# Patient Record
Sex: Female | Born: 1975 | Race: White | Hispanic: No | Marital: Married | Smoking: Never smoker
Health system: Southern US, Community
[De-identification: ages and names within clinical notes are randomized; demographics above are authoritative.]

## PROBLEM LIST (undated history)

## (undated) DIAGNOSIS — G894 Chronic pain syndrome: Secondary | ICD-10-CM

## (undated) DIAGNOSIS — F419 Anxiety disorder, unspecified: Secondary | ICD-10-CM

## (undated) DIAGNOSIS — F1190 Opioid use, unspecified, uncomplicated: Secondary | ICD-10-CM

## (undated) DIAGNOSIS — T7840XA Allergy, unspecified, initial encounter: Secondary | ICD-10-CM

## (undated) DIAGNOSIS — M797 Fibromyalgia: Secondary | ICD-10-CM

## (undated) DIAGNOSIS — I341 Nonrheumatic mitral (valve) prolapse: Secondary | ICD-10-CM

## (undated) DIAGNOSIS — R000 Tachycardia, unspecified: Secondary | ICD-10-CM

## (undated) DIAGNOSIS — R768 Other specified abnormal immunological findings in serum: Secondary | ICD-10-CM

## (undated) DIAGNOSIS — M069 Rheumatoid arthritis, unspecified: Secondary | ICD-10-CM

## (undated) DIAGNOSIS — G43909 Migraine, unspecified, not intractable, without status migrainosus: Secondary | ICD-10-CM

## (undated) DIAGNOSIS — G8929 Other chronic pain: Secondary | ICD-10-CM

## (undated) DIAGNOSIS — M352 Behcet's disease: Secondary | ICD-10-CM

## (undated) HISTORY — PX: HERNIA REPAIR: SHX51

## (undated) HISTORY — PX: WISDOM TOOTH EXTRACTION: SHX21

## (undated) HISTORY — DX: Behcet's disease: M35.2

## (undated) HISTORY — DX: Fibromyalgia: M79.7

## (undated) HISTORY — DX: Nonrheumatic mitral (valve) prolapse: I34.1

## (undated) HISTORY — DX: Allergy, unspecified, initial encounter: T78.40XA

## (undated) HISTORY — DX: Rheumatoid arthritis, unspecified: M06.9

## (undated) HISTORY — PX: MANDIBLE FRACTURE SURGERY: SHX706

## (undated) HISTORY — DX: Anxiety disorder, unspecified: F41.9

## (undated) HISTORY — DX: Other specified abnormal immunological findings in serum: R76.8

## (undated) HISTORY — DX: Tachycardia, unspecified: R00.0

## (undated) HISTORY — PX: LAPAROSCOPIC ABDOMINAL EXPLORATION: SHX6249

## (undated) HISTORY — DX: Migraine, unspecified, not intractable, without status migrainosus: G43.909

---
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
## 1998-04-05 ENCOUNTER — Other Ambulatory Visit: Admission: RE | Admit: 1998-04-05 | Discharge: 1998-04-05 | Payer: Self-pay | Admitting: Obstetrics and Gynecology

## 1999-05-31 ENCOUNTER — Other Ambulatory Visit: Admission: RE | Admit: 1999-05-31 | Discharge: 1999-05-31 | Payer: Self-pay | Admitting: *Deleted

## 1999-05-31 ENCOUNTER — Encounter (INDEPENDENT_AMBULATORY_CARE_PROVIDER_SITE_OTHER): Payer: Self-pay

## 1999-08-15 ENCOUNTER — Other Ambulatory Visit: Admission: RE | Admit: 1999-08-15 | Discharge: 1999-08-15 | Payer: Self-pay | Admitting: *Deleted

## 2000-09-17 ENCOUNTER — Other Ambulatory Visit: Admission: RE | Admit: 2000-09-17 | Discharge: 2000-09-17 | Payer: Self-pay | Admitting: Obstetrics and Gynecology

## 2001-10-15 ENCOUNTER — Other Ambulatory Visit: Admission: RE | Admit: 2001-10-15 | Discharge: 2001-10-15 | Payer: Self-pay | Admitting: Obstetrics and Gynecology

## 2002-05-19 ENCOUNTER — Ambulatory Visit (HOSPITAL_COMMUNITY): Admission: RE | Admit: 2002-05-19 | Discharge: 2002-05-19 | Payer: Self-pay | Admitting: Obstetrics & Gynecology

## 2002-05-19 ENCOUNTER — Encounter: Payer: Self-pay | Admitting: Obstetrics & Gynecology

## 2002-11-12 ENCOUNTER — Other Ambulatory Visit: Admission: RE | Admit: 2002-11-12 | Discharge: 2002-11-12 | Payer: Self-pay | Admitting: Obstetrics and Gynecology

## 2006-02-08 ENCOUNTER — Other Ambulatory Visit: Admission: RE | Admit: 2006-02-08 | Discharge: 2006-02-08 | Payer: Self-pay | Admitting: Gynecology

## 2006-04-16 ENCOUNTER — Ambulatory Visit (HOSPITAL_COMMUNITY): Admission: RE | Admit: 2006-04-16 | Discharge: 2006-04-16 | Payer: Self-pay | Admitting: Gynecology

## 2006-07-25 ENCOUNTER — Ambulatory Visit (HOSPITAL_COMMUNITY): Admission: RE | Admit: 2006-07-25 | Discharge: 2006-07-25 | Payer: Self-pay | Admitting: Gynecology

## 2006-08-15 ENCOUNTER — Other Ambulatory Visit: Admission: RE | Admit: 2006-08-15 | Discharge: 2006-08-15 | Payer: Self-pay | Admitting: Gynecology

## 2007-01-02 ENCOUNTER — Other Ambulatory Visit: Admission: RE | Admit: 2007-01-02 | Discharge: 2007-01-02 | Payer: Self-pay | Admitting: Gynecology

## 2007-06-26 ENCOUNTER — Other Ambulatory Visit: Admission: RE | Admit: 2007-06-26 | Discharge: 2007-06-26 | Payer: Self-pay | Admitting: Gynecology

## 2007-12-18 ENCOUNTER — Other Ambulatory Visit: Admission: RE | Admit: 2007-12-18 | Discharge: 2007-12-18 | Payer: Self-pay | Admitting: Gynecology

## 2008-02-27 HISTORY — PX: LAPAROSCOPY: SHX197

## 2010-06-05 ENCOUNTER — Ambulatory Visit: Payer: Self-pay | Admitting: Cardiology

## 2010-10-27 ENCOUNTER — Ambulatory Visit: Payer: Self-pay | Admitting: Internal Medicine

## 2011-01-15 DIAGNOSIS — M810 Age-related osteoporosis without current pathological fracture: Secondary | ICD-10-CM | POA: Insufficient documentation

## 2011-01-15 DIAGNOSIS — M352 Behcet's disease: Secondary | ICD-10-CM | POA: Insufficient documentation

## 2011-01-16 DIAGNOSIS — Z79899 Other long term (current) drug therapy: Secondary | ICD-10-CM | POA: Insufficient documentation

## 2011-01-16 DIAGNOSIS — M797 Fibromyalgia: Secondary | ICD-10-CM | POA: Insufficient documentation

## 2011-08-10 ENCOUNTER — Ambulatory Visit: Payer: Self-pay

## 2011-11-21 DIAGNOSIS — M069 Rheumatoid arthritis, unspecified: Secondary | ICD-10-CM | POA: Insufficient documentation

## 2012-07-08 ENCOUNTER — Ambulatory Visit: Payer: Self-pay

## 2014-10-18 ENCOUNTER — Encounter: Payer: Self-pay | Admitting: Family Medicine

## 2014-10-18 ENCOUNTER — Ambulatory Visit (INDEPENDENT_AMBULATORY_CARE_PROVIDER_SITE_OTHER): Payer: 59 | Admitting: Family Medicine

## 2014-10-18 VITALS — BP 118/83 | HR 119 | Temp 99.2°F | Ht 63.0 in | Wt 100.0 lb

## 2014-10-18 DIAGNOSIS — M25521 Pain in right elbow: Secondary | ICD-10-CM | POA: Diagnosis not present

## 2014-10-18 NOTE — Progress Notes (Signed)
  BP 118/83 mmHg  Pulse 119  Temp(Src) 99.2 F (37.3 C)  Ht 5\' 3"  (1.6 m)  Wt 100 lb (45.36 kg)  BMI 17.72 kg/m2  SpO2 96%   Subjective:    Patient ID: , female    DOB: October 01, 1975, 39 y.o.   MRN: [id number]  HPI: Rebekah Martin is a 39 y.o. female  Chief Complaint  Patient presents with  . Arm Pain   ARM PAIN Duration: 1 weeks Location: right forearm and elbow Mechanism of injury: unknown Onset: sudden Severity: 7/10 with extension and pressure Quality:  aching, sore and tender Frequency: intermittent Radiation: no Aggravating factors: pushing on it and extending her elbow  Alleviating factors: nothing  Status: stable Treatments attempted: rest and aleve  Relief with NSAIDs?:  no Swelling: yes Redness: no  Warmth: no Trauma: no Chest pain: no  Shortness of breath: no  Fever: no Decreased sensation: no Paresthesias: no Weakness: no  Relevant past medical, surgical, family and social history reviewed and updated as indicated. Interim medical history since our last visit reviewed. Allergies and medications reviewed and updated.  Review of Systems  Constitutional: Negative.   Respiratory: Negative.   Cardiovascular: Negative.   Musculoskeletal: Positive for myalgias. Negative for back pain, joint swelling, arthralgias, gait problem, neck pain and neck stiffness.  Psychiatric/Behavioral: Negative.     Per HPI unless specifically indicated above     Objective:    BP 118/83 mmHg  Pulse 119  Temp(Src) 99.2 F (37.3 C)  Ht 5\' 3"  (1.6 m)  Wt 100 lb (45.36 kg)  BMI 17.72 kg/m2  SpO2 96%  Wt Readings from Last 3 Encounters:  10/18/14 100 lb (45.36 kg)    Physical Exam  Constitutional: She is oriented to person, place, and time. She appears well-developed and well-nourished. No distress.  HENT:  Head: Normocephalic and atraumatic.  Right Ear: Hearing normal.  Left Ear: Hearing normal.  Nose: Nose normal.  Eyes: Conjunctivae and lids are  normal. Right eye exhibits no discharge. Left eye exhibits no discharge. No scleral icterus.  Pulmonary/Chest: Effort normal. No respiratory distress.  Musculoskeletal: Normal range of motion.  Mild swelling on the medial side of her R forearm with some mild tenderness to palpation. No discrete nodules or lipomas.   Neurological: She is alert and oriented to person, place, and time.  Skin: Skin is intact. No rash noted.  Psychiatric: She has a normal mood and affect. Her speech is normal and behavior is normal. Judgment and thought content normal. Cognition and memory are normal.  Nursing note and vitals reviewed.   No results found for this or any previous visit.    Assessment & Plan:   Problem List Items Addressed This Visit    None    Visit Diagnoses    Right elbow pain    -  Primary    Seems to be muscular. Will ice/heat depending on comfort and continue meloxicam. If not better in 5 days, will give Rx for epicondylitis brace. Monitor.         Follow up plan: Return if symptoms worsen or fail to improve.

## 2014-10-18 NOTE — Patient Instructions (Signed)
Medial Epicondylitis (Golfer's Elbow) with Rehab Medial epicondylitis involves inflammation and pain around the inner (medial) portion of the elbow. This pain is caused by inflammation of the tendons in the forearm that flex (bring down) the wrist. Medial epicondylitis is also called golfer's elbow, because it is common among golfers. However, it may occur in any individual who flexes the wrist regularly. If medial epicondylitis is left untreated, it may become a chronic problem. SYMPTOMS   Pain, tenderness, or inflammation over the inner (medial) side of the elbow.  Pain or weakness with gripping activities.  Pain that increases with wrist twisting motions (using a screwdriver, playing golf, bowling). CAUSES  Medial epicondylitis is caused by inflammation of the tendons that flex the wrist. Causes of injury may include:  Chronic, repetitive stress and strain to the tendons that run from the wrist and forearm to the elbow.  Sudden strain on the forearm, including wrist snap when serving balls with racquet sports, or throwing a baseball. RISK INCREASES WITH:  Sports or occupations that require repetitive and/or strenuous forearm and wrist movements (pitching a baseball, golfing, carpentry).  Poor wrist and forearm strength and flexibility.  Failure to warm up properly before activity.  Resuming activity before healing, rehabilitation, and conditioning are complete. PREVENTION   Warm up and stretch properly before activity.  Maintain physical fitness:  Strength, flexibility, and endurance.  Cardiovascular fitness.  Wear and use properly fitted equipment.  Learn and use proper technique and have a coach correct improper technique.  Wear a tennis elbow (counterforce) brace. PROGNOSIS  The course of this condition depends on the degree of the injury. If treated properly, acute cases (symptoms lasting less than 4 weeks) are often resolved in 2 to 6 weeks. Chronic (longer lasting  cases) often resolve in 3 to 6 months, but may require physical therapy. RELATED COMPLICATIONS   Frequently recurring symptoms, resulting in a chronic problem. Properly treating the problem the first time decreases frequency of recurrence.  Chronic inflammation, scarring, and partial tendon tear, requiring surgery.  Delayed healing or resolution of symptoms. TREATMENT  Treatment first involves the use of ice and medicine, to reduce pain and inflammation. Strengthening and stretching exercises may reduce discomfort, if performed regularly. These exercises may be performed at home, if the condition is an acute injury. Chronic cases may require a referral to a physical therapist for evaluation and treatment. Your caregiver may advise a corticosteroid injection to help reduce inflammation. Rarely, surgery is needed. MEDICATION  If pain medicine is needed, nonsteroidal anti-inflammatory medicines (aspirin and ibuprofen), or other minor pain relievers (acetaminophen), are often advised.  Do not take pain medicine for 7 days before surgery.  Prescription pain relievers may be given, if your caregiver thinks they are needed. Use only as directed and only as much as you need.  Corticosteroid injections may be recommended. These injections should be reserved only for the most severe cases, because they can only be given a certain number of times. HEAT AND COLD  Cold treatment (icing) should be applied for 10 to 15 minutes every 2 to 3 hours for inflammation and pain, and immediately after activity that aggravates your symptoms. Use ice packs or an ice massage.  Heat treatment may be used before performing stretching and strengthening activities prescribed by your caregiver, physical therapist, or athletic trainer. Use a heat pack or a warm water soak. SEEK MEDICAL CARE IF: Symptoms get worse or do not improve in 2 weeks, despite treatment. EXERCISES  RANGE OF MOTION (  ROM) AND STRETCHING EXERCISES -  Epicondylitis, Medial (Golfer's Elbow) These exercises may help you when beginning to rehabilitate your injury. Your symptoms may go away with or without further involvement from your physician, physical therapist or athletic trainer. While completing these exercises, remember:   Restoring tissue flexibility helps normal motion to return to the joints. This allows healthier, less painful movement and activity.  An effective stretch should be held for at least 30 seconds.  A stretch should never be painful. You should only feel a gentle lengthening or release in the stretched tissue. RANGE OF MOTION - Wrist Flexion, Active-Assisted  Extend your right / left elbow with your fingers pointing down.*  Gently pull the back of your hand towards you, until you feel a gentle stretch on the top of your forearm.  Hold this position for __________ seconds. Repeat __________ times. Complete this exercise __________ times per day.  *If directed by your physician, physical therapist or athletic trainer, complete this stretch with your elbow bent, rather than extended. RANGE OF MOTION - Wrist Extension, Active-Assisted  Extend your right / left elbow and turn your palm upwards.*  Gently pull your palm and fingertips back, so your wrist extends and your fingers point more toward the ground.  You should feel a gentle stretch on the inside of your forearm.  Hold this position for __________ seconds. Repeat __________ times. Complete this exercise __________ times per day. *If directed by your physician, physical therapist or athletic trainer, complete this stretch with your elbow bent, rather than extended. STRETCH - Wrist Extension   Place your right / left fingertips on a tabletop leaving your elbow slightly bent. Your fingers should point backwards.  Gently press your fingers and palm down onto the table, by straightening your elbow. You should feel a stretch on the inside of your forearm.  Hold  this position for __________ seconds. Repeat __________ times. Complete this stretch __________ times per day.  STRENGTHENING EXERCISES - Epicondylitis, Medial (Golfer's Elbow) These exercises may help you when beginning to rehabilitate your injury. They may resolve your symptoms with or without further involvement from your physician, physical therapist or athletic trainer. While completing these exercises, remember:   Muscles can gain both the endurance and the strength needed for everyday activities through controlled exercises.  Complete these exercises as instructed by your physician, physical therapist or athletic trainer. Increase the resistance and repetitions only as guided.  You may experience muscle soreness or fatigue, but the pain or discomfort you are trying to eliminate should never worsen during these exercises. If this pain does get worse, stop and make sure you are following the directions exactly. If the pain is still present after adjustments, discontinue the exercise until you can discuss the trouble with your caregiver. STRENGTH - Wrist Flexors  Sit with your right / left forearm palm-up, and fully supported on a table or countertop. Your elbow should be resting below the height of your shoulder. Allow your wrist to extend over the edge of the surface.  Loosely holding a __________ weight, or a piece of rubber exercise band or tubing, slowly curl your hand up toward your forearm.  Hold this position for __________ seconds. Slowly lower the wrist back to the starting position in a controlled manner. Repeat __________ times. Complete this exercise __________ times per day.  STRENGTH - Wrist Extensors  Sit with your right / left forearm palm-down and fully supported. Your elbow should be resting below the height of your shoulder.   Allow your wrist to extend over the edge of the surface.  Loosely holding a __________ weight, or a piece of rubber exercise band or tubing, slowly  curl your hand up toward your forearm.  Hold this position for __________ seconds. Slowly lower the wrist back to the starting position in a controlled manner. Repeat __________ times. Complete this exercise __________ times per day.  STRENGTH - Ulnar Deviators  Stand with a ____________________ weight in your right / left hand, or sit while holding a rubber exercise band or tubing, with your healthy arm supported on a table or countertop.  Move your wrist so that your pinkie travels toward your forearm and your thumb moves away from your forearm.  Hold this position for __________ seconds and then slowly lower the wrist back to the starting position. Repeat __________ times. Complete this exercise __________ times per day STRENGTH - Grip   Grasp a tennis ball, a dense sponge, or a large, rolled sock in your hand.  Squeeze as hard as you can, without increasing any pain.  Hold this position for __________ seconds. Release your grip slowly. Repeat __________ times. Complete this exercise __________ times per day.  STRENGTH - Forearm Supinators   Sit with your right / left forearm supported on a table, keeping your elbow below shoulder height. Rest your hand over the edge, palm down.  Gently grip a hammer or a soup ladle.  Without moving your elbow, slowly turn your palm and hand upward to a "thumbs-up" position.  Hold this position for __________ seconds. Slowly return to the starting position. Repeat __________ times. Complete this exercise __________ times per day.  STRENGTH - Forearm Pronators  Sit with your right / left forearm supported on a table, keeping your elbow below shoulder height. Rest your hand over the edge, palm up.  Gently grip a hammer or a soup ladle.  Without moving your elbow, slowly turn your palm and hand upward to a "thumbs-up" position.  Hold this position for __________ seconds. Slowly return to the starting position. Repeat __________ times. Complete  this exercise __________ times per day.  Document Released: 02/12/2005 Document Revised: 05/07/2011 Document Reviewed: 05/27/2008 [employer] Patient Information 2015 [employer]. This information is not intended to replace advice given to you by your health care provider. Make sure you discuss any questions you have with your health care provider.  

## 2015-03-04 DIAGNOSIS — M797 Fibromyalgia: Secondary | ICD-10-CM | POA: Diagnosis not present

## 2015-03-04 DIAGNOSIS — M0609 Rheumatoid arthritis without rheumatoid factor, multiple sites: Secondary | ICD-10-CM | POA: Diagnosis not present

## 2015-03-04 DIAGNOSIS — Z79899 Other long term (current) drug therapy: Secondary | ICD-10-CM | POA: Diagnosis not present

## 2015-03-04 DIAGNOSIS — M810 Age-related osteoporosis without current pathological fracture: Secondary | ICD-10-CM | POA: Diagnosis not present

## 2015-03-04 DIAGNOSIS — M352 Behcet's disease: Secondary | ICD-10-CM | POA: Diagnosis not present

## 2015-03-15 DIAGNOSIS — R768 Other specified abnormal immunological findings in serum: Secondary | ICD-10-CM | POA: Insufficient documentation

## 2015-03-15 DIAGNOSIS — M352 Behcet's disease: Secondary | ICD-10-CM | POA: Diagnosis not present

## 2015-05-19 ENCOUNTER — Other Ambulatory Visit: Payer: Self-pay

## 2015-05-19 MED ORDER — ONDANSETRON HCL 4 MG PO TABS: 4.0000 mg | ORAL_TABLET | Freq: Three times a day (TID) | ORAL | Status: DC | PRN

## 2015-05-19 NOTE — Telephone Encounter (Signed)
Pharmacy is employee pharmacy.

## 2015-08-11 ENCOUNTER — Telehealth: Payer: Self-pay | Admitting: Pharmacist

## 2015-08-11 NOTE — Telephone Encounter (Signed)
Attempted to contact patient to schedule an appointment for the [facility] Specialty Medication Clinical and I was unable to reach her as the number has been disconnected. Will send a HIPAA-compliant email to ask for the best way to contact her.

## 2015-08-16 ENCOUNTER — Encounter: Payer: Self-pay | Admitting: Pharmacist

## 2015-08-23 ENCOUNTER — Ambulatory Visit (HOSPITAL_BASED_OUTPATIENT_CLINIC_OR_DEPARTMENT_OTHER): Payer: Self-pay | Admitting: Pharmacist

## 2015-08-23 DIAGNOSIS — M069 Rheumatoid arthritis, unspecified: Secondary | ICD-10-CM

## 2015-08-23 MED ORDER — ADALIMUMAB 40 MG/0.8ML ~~LOC~~ AJKT: 40.0000 mg | AUTO-INJECTOR | SUBCUTANEOUS | Status: DC

## 2015-08-23 NOTE — Progress Notes (Signed)
S: Patient presents today to the [facility].  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 Patient is managed by [facility] for this.   Adherence: denies any missed doses  Dosing:  Rheumatoid arthritis: SubQ: 40 mg every other week (may continue methotrexate, other nonbiologic DMARDS, corticosteroids, NSAIDs, and/or analgesics); patients not taking concomitant methotrexate may increase dose to 40 mg every week  Drug-drug interactions: none  Screening: TB test: completed Hepatitis: completed  Monitoring: S/sx of infection: denies CBC: last one at [facility] January 2017. S/sx of hypersensitivity: denies S/sx of malignancy: denies S/sx of heart failure: denies    O:  Labs reviewed via Care Everywhere: WBC 3.9, Hgb 11.5, LFTs WNL     No results found for: WBC, HGB, HCT, MCV, PLT    Chemistry   No results found for: NA, K, CL, CO2, BUN, CREATININE, GLU No results found for: CALCIUM, ALKPHOS, AST, ALT, BILITOT     A/P: 1. Medication review: Patient on Humira for RA and Behcet's syndrome and is tolerating it well, with no adverse effects, and good efficacy. Reviewed the medication with her, including the following: Humira is a TNF blocking agent indicated for ankylosing spondylitis, Crohn's disease, Hidradenitis suppurativa, psoriatic arthritis, plaque psoriasis, ulcerative colitis, and uveitis. The most common adverse effects are infections, headache, and injection site reactions. There is the possibility of an increased risk of malignancy but it is not well understood if this increased risk is due to there medication or the disease state. There are rare cases of pancytopenia and aplastic anemia. No recommendations for any changes.   Juanita Craver, PharmD, BCPS, CPP Clinical Pharmacist Practitioner  [facility] [phone]  Evaluation and management procedures were  performed by the Clinical Pharmacy Practitioner under my supervision and collaboration. I have reviewed the Practitioner's note and chart, and I agree with the management and plan as documented above.   Jeanann Lewandowsky, MD, MHA, CPE, FACP, FAAP [facility] [place], [place] [phone]   08/23/2015, 4:01 PM

## 2015-08-24 MED FILL — HUMIRA PEN 40 MG/0.8ML PNKT: 40 | 28 days supply | Qty: 2 | Fill #0

## 2015-09-29 MED FILL — HUMIRA PEN 40 MG/0.8ML PNKT: 40 | 28 days supply | Qty: 2 | Fill #1

## 2015-10-25 MED FILL — HUMIRA PEN 40 MG/0.8ML PNKT: 40 | 28 days supply | Qty: 2 | Fill #2

## 2015-11-23 MED FILL — HUMIRA PEN 40 MG/0.8ML PNKT: 40 | 28 days supply | Qty: 2 | Fill #3

## 2015-12-22 MED FILL — HUMIRA PEN 40 MG/0.8ML PNKT: 40 | 28 days supply | Qty: 2 | Fill #4

## 2016-01-17 MED FILL — HUMIRA PEN 40 MG/0.8ML PNKT: 40 | 28 days supply | Qty: 2 | Fill #5

## 2016-02-07 ENCOUNTER — Ambulatory Visit: Payer: 59 | Attending: Anesthesiology | Admitting: Anesthesiology

## 2016-02-07 ENCOUNTER — Encounter: Payer: Self-pay | Admitting: Anesthesiology

## 2016-02-07 VITALS — BP 119/65 | HR 123 | Temp 97.8°F | Resp 16 | Ht 63.0 in | Wt 100.0 lb

## 2016-02-07 DIAGNOSIS — G894 Chronic pain syndrome: Secondary | ICD-10-CM

## 2016-02-07 DIAGNOSIS — M06031 Rheumatoid arthritis without rheumatoid factor, right wrist: Secondary | ICD-10-CM | POA: Diagnosis not present

## 2016-02-07 DIAGNOSIS — M06032 Rheumatoid arthritis without rheumatoid factor, left wrist: Secondary | ICD-10-CM

## 2016-02-07 DIAGNOSIS — M352 Behcet's disease: Secondary | ICD-10-CM | POA: Diagnosis not present

## 2016-02-07 DIAGNOSIS — M797 Fibromyalgia: Secondary | ICD-10-CM | POA: Diagnosis not present

## 2016-02-07 NOTE — Progress Notes (Signed)
Safety precautions to be maintained throughout the outpatient stay will include: orient to surroundings, keep bed in low position, maintain call bell within reach at all times, provide assistance with transfer out of bed and ambulation.  

## 2016-02-08 NOTE — Progress Notes (Signed)
Subjective:  Patient ID: Rebekah Martin, female    DOB: 1975-04-24  Age: 40 y.o. MRN: [id number]  CC: Oral Pain (mouth ulcers); Knee Pain (both); and Joint Swelling (both)      PROCEDURE:None  HPI Rebekah Martin presents for a new patient evaluation. She is seen by Dr. Willette Pa at [facility] rheumatology. 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 She also has a hx of Behcets disease. She describes diffuse body pain it's been present for 17 years. It affects her shoulders and low back wrists knees calves and shins ankles and feet. She has been on tramadol taking this 2 times a day which helps and Mobic which helps. Unfortunately she also gets occasional oral ulcerations and these are ineffective for that. She has taken local anesthetic topical last lotion which is helped with the oral ulcerations when they occur. Her joint pain is persistent and has  gradually gotten worse. She describes a pain that is present morning and night during exercise and after exercise and aggravated by bending climbing kneeling lifting motion in sitting. Alleviating factors include warm Martin and medication management sleep rest walking cold and hot application. The pain is sharp tender throbbing tiring and located in most joints. It is disabling. She has failed conservative therapy with simple anti-inflammatory and Tylenol and has been on tramadol with good success and no significant side effects. She has never had any injection therapy for her back pain. She has been on oral steroids. Tramadol yields 80% improvement in her pain. She denies numbness or tingling affecting the lower extremities.  History Olanda has a past medical history of Allergy; Anxiety; Behcet's disease (HCC); Fibromyalgia; Migraine; Mitral valve prolapse; Positive ANA (antinuclear antibody); RA (rheumatoid arthritis) (HCC); Rheumatoid arthritis (HCC); and Tachycardia.   She has a past surgical history that  includes Laparoscopic abdominal exploration; Mandible fracture surgery; Wisdom tooth extraction; Hernia repair; and laparoscopy (2010).   Her family history includes Alzheimer's disease in her paternal grandmother; COPD in her paternal grandfather; Cancer in her maternal grandfather; Hyperlipidemia in her mother; Hypertension in her father, maternal grandmother, and mother; Lung disease in her maternal grandmother.She reports that she has never smoked. She has never used smokeless tobacco. She reports that she drinks alcohol. She reports that she does not use drugs.  No results found for this or any previous visit.  No results found for: TOXASSSELUR  Outpatient Medications Prior to Visit  Medication Sig Dispense Refill  . Adalimumab (HUMIRA PEN) 40 MG/0.8ML PNKT Inject 40 mg into the skin every 14 (fourteen) days. 2 each 6  . docusate sodium (STOOL SOFTENER) 100 MG capsule Take 100 mg by mouth 2 (two) times daily.    . DULoxetine (CYMBALTA) 30 MG capsule Take 30 mg by mouth daily.    . meloxicam (MOBIC) 15 MG tablet Take 15 mg by mouth daily as needed for pain.    Marland Kitchen ondansetron (ZOFRAN) 4 MG tablet Take 1 tablet (4 mg total) by mouth every 8 (eight) hours as needed. 20 tablet 1  . sulfaSALAzine (AZULFIDINE) 500 MG tablet Take 500 mg by mouth 4 (four) times daily. Three tablets in the morning and 2 tablets in the evening    . traMADol (ULTRAM) 50 MG tablet Take by mouth every 6 (six) hours as needed.    . triamcinolone (KENALOG) 0.1 % paste Apply to affected area(s) 2 times per day as needed    . Calcium Carbonate-Vit D-Min (CALCIUM 600 +  MINERALS PO) Take 600 mg by mouth 2 (two) times daily.    . clonazePAM (KLONOPIN) 0.5 MG tablet Take 0.5 mg by mouth daily as needed for anxiety.    . Multiple Vitamins-Minerals (MULTIVITAMIN ADULT PO) Take by mouth daily.    . predniSONE (DELTASONE) 5 MG tablet Take 10 mg by mouth daily with breakfast.    . propranolol (INDERAL) 10 MG tablet Take 10 mg by  mouth 4 (four) times daily as needed.    . ranitidine (ZANTAC) 150 MG tablet Take 150 mg by mouth daily.    . SUMAtriptan (IMITREX) 25 MG tablet Take 25 mg by mouth every 2 (two) hours as needed for migraine. May repeat in 2 hours if headache persists or recurs.     No facility-administered medications prior to visit.    No results found for: WBC, HGB, HCT, PLT, GLUCOSE, CHOL, TRIG, HDL, LDLDIRECT, LDLCALC, ALT, AST, NA, K, CL, CREATININE, BUN, CO2, TSH, PSA, INR, GLUF, HGBA1C, MICROALBUR  --------------------------------------------------------------------------------------------------------------------- Dg Knee Complete 4 Views Right  Result Date: 07/08/2012 * PRIOR REPORT IMPORTED FROM AN EXTERNAL SYSTEM * PRIOR REPORT IMPORTED FROM THE SYNGO WORKFLOW SYSTEM REASON FOR EXAM:    right knee pain 1 week COMMENTS: PROCEDURE:     GDR - GDR KNEE RT COMP WITH OBLIQUES  - Jul 08 2012 12:51PM RESULT:     Images of the right knee demonstrate no definite fracture, dislocation or radiopaque foreign body. IMPRESSION:      Please see above. Dictation Site: 2        ---------------------------------------------------------------------------------------------------------------------- Past Medical History:  Diagnosis Date  . Allergy   . Anxiety   . Behcet's disease (HCC)   . Fibromyalgia   . Migraine   . Mitral valve prolapse   . Positive ANA (antinuclear antibody)   . RA (rheumatoid arthritis) (HCC)   . Rheumatoid arthritis (HCC)   . Tachycardia     Past Surgical History:  Procedure Laterality Date  . HERNIA REPAIR     age 69  . LAPAROSCOPIC ABDOMINAL EXPLORATION     X 2  . LAPAROSCOPY  2010   ovarian cyst and endometriosis  . MANDIBLE FRACTURE SURGERY    . WISDOM TOOTH EXTRACTION      Family History  Problem Relation Age of Onset  . Hypertension Mother   . Hyperlipidemia Mother   . Hypertension Father   . Hypertension Maternal Grandmother   . Lung disease Maternal Grandmother    . Cancer Maternal Grandfather     melanoma  . Alzheimer's disease Paternal Grandmother   . COPD Paternal Grandfather     Social History  Substance Use Topics  . Smoking status: Never Smoker  . Smokeless tobacco: Never Used  . Alcohol use Yes     Comment: on occasion    ---------------------------------------------------------------------------------------------------------------------- Social History   Social History  . Marital status: Married    Spouse name: N/A  . Number of children: N/A  . Years of education: N/A   Social History Main Topics  . Smoking status: Never Smoker  . Smokeless tobacco: Never Used  . Alcohol use Yes     Comment: on occasion  . Drug use: No  . Sexual activity: Yes    Birth control/ protection: None   Other Topics Concern  . Not on file   Social History Narrative  . No narrative on file    Scheduled Meds: Continuous Infusions: PRN Meds:.   BP 119/65 (BP Location: Right Arm, Patient Position: Sitting, Cuff  Size: Normal)   Pulse (!) 123   Temp 97.8 F (36.6 C) (Oral)   Resp 16   Ht 5\' 3"  (1.6 m)   Wt 100 lb (45.4 kg)   LMP 01/31/2016   SpO2 100%   BMI 17.71 kg/m    BP Readings from Last 3 Encounters:  02/07/16 119/65  10/18/14 118/83     Wt Readings from Last 3 Encounters:  02/07/16 100 lb (45.4 kg)  10/18/14 100 lb (45.4 kg)     ----------------------------------------------------------------------------------------------------------------------  ROS Review of Systems  Cardiac: No angina Pulmonary: No shortness of breath or wheezing Psychological: History of anxiety but no suicidal ideation Musculoskeletal: Some intermittent lower extremity weakness Rheumatologic: History of diffuse joint pain as mentioned above  Objective:  BP 119/65 (BP Location: Right Arm, Patient Position: Sitting, Cuff Size: Normal)   Pulse (!) 123   Temp 97.8 F (36.6 C) (Oral)   Resp 16   Ht 5\' 3"  (1.6 m)   Wt 100 lb (45.4 kg)    LMP 01/31/2016   SpO2 100%   BMI 17.71 kg/m   Physical Exam  Patient is alert oriented cooperative compliant Pupils are equally round reactive to light Heart is regular rate and rhythm without murmur Lungs are clear to also dictation without wheeze With the patient in the seated position she has Ace negative straight leg raise bilaterally. She does have diminished strength on flexion at the knees rated at a 4+ over 5 which she reports has been chronic. She ambulates with a normal gait and no apparent weakness. She does have diminished muscle bulk in the lower extremities but she is also slight in body habitus. No fasciculations are noted     Assessment & Plan:   Rebekah Martin was seen today for oral pain, knee pain and joint swelling.  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) -     ToxASSURE Select 13 (MW), Urine  Chronic pain syndrome -     ToxASSURE Select 13 (MW), Urine  Behcet's syndrome (HCC) -     ToxASSURE Select 13 (MW), Urine     ----------------------------------------------------------------------------------------------------------------------  Problem List Items Addressed This Visit      Other   Behcet's syndrome (HCC)   Relevant Orders   ToxASSURE Select 13 (MW), Urine    Other Visit Diagnoses    Fibromyalgia syndrome    -  Primary   Relevant Orders   ToxASSURE Select 13 (MW), Urine   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 Fibromyalgia syndrome I think it's reasonable for her to continue with the tramadol as this is well tolerated and of her low risk profile. She has medications to get her until her next appointment in one month at which point we will cover this for her. - ToxASSURE Select 13 (MW), Urine  2. 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 Chronic pain syndrome As above   4. Behcet's syndrome (HCC)  In reference to the lower extremity weakness on flexion at the knees this has  been stable but should she note any changes, nerve conduction studies may be warranted.    ----------------------------------------------------------------------------------------------------------------------  I am having Rebekah Martin maintain her ranitidine, DULoxetine, predniSONE, meloxicam, clonazePAM, propranolol, sulfaSALAzine, SUMAtriptan, traMADol, Calcium Carbonate-Vit D-Min (CALCIUM 600 + MINERALS PO), docusate sodium, Multiple Vitamins-Minerals (MULTIVITAMIN ADULT PO), triamcinolone, ondansetron, Adalimumab, hydroxychloroquine, hydroxychloroquine, nystatin, sulfaSALAzine, clonazePAM, docusate sodium, DULoxetine, meloxicam, ondansetron, propranolol, ranitidine, SUMAtriptan, traMADol, triamcinolone, and Esomeprazole Magnesium (NEXIUM PO).   Meds ordered this encounter  Medications  . hydroxychloroquine (PLAQUENIL) 200 MG tablet    Sig: Take by mouth.  . hydroxychloroquine (PLAQUENIL) 200 MG tablet    Refill:  11  . nystatin (MYCOSTATIN) 100000 UNIT/ML suspension    Sig: 5 ml swish and swallow 4 times a day as needed  . sulfaSALAzine (AZULFIDINE) 500 MG EC tablet    Sig: Take 1,500 mg by mouth daily.     Refill:  3  . clonazePAM (KLONOPIN) 0.5 MG tablet     Sig: as needed.  . docusate sodium (STOOL SOFTENER) 100 MG capsule  . DULoxetine (CYMBALTA) 30 MG capsule    Sig: Take by mouth.  . meloxicam (MOBIC) 15 MG tablet    Sig: Take by mouth.  . ondansetron (ZOFRAN) 4 MG tablet  . propranolol (INDERAL) 10 MG tablet    Sig: prn  . ranitidine (ZANTAC) 150 MG tablet  . SUMAtriptan (IMITREX) 25 MG tablet    Sig: Take by mouth.  . traMADol (ULTRAM) 50 MG tablet    Sig: Take by mouth.  . triamcinolone (KENALOG) 0.1 % paste    Sig: Apply to affected area(s) 2 times per day as needed  . Esomeprazole Magnesium (NEXIUM PO)    Sig: Take 22.3 mg by mouth daily.       Follow-up: Return in about 1 month (around 03/09/2016) for evaluation, med refill.    Yevette EdwardsJames G Shakari Qazi, MD  The [place] practitioner database for opioid medications on this patient has been reviewed by me and my staff   Greater than 50% of the total encounter time was spent in counseling and / or coordination of care.     This dictation was performed utilizing Conservation officer, historic buildingsDragon voice recognition software.  Please excuse any unintentional or mistaken typographical errors as a result.

## 2016-02-10 ENCOUNTER — Ambulatory Visit (INDEPENDENT_AMBULATORY_CARE_PROVIDER_SITE_OTHER): Payer: 59 | Admitting: Family Medicine

## 2016-02-10 ENCOUNTER — Encounter: Payer: Self-pay | Admitting: Family Medicine

## 2016-02-10 VITALS — BP 131/85 | HR 138 | Temp 98.9°F | Ht 63.0 in | Wt 100.0 lb

## 2016-02-10 DIAGNOSIS — R6889 Other general symptoms and signs: Secondary | ICD-10-CM | POA: Diagnosis not present

## 2016-02-10 DIAGNOSIS — R112 Nausea with vomiting, unspecified: Secondary | ICD-10-CM

## 2016-02-10 LAB — VERITOR FLU A/B WAIVED
INFLUENZA A: NEGATIVE
INFLUENZA B: NEGATIVE

## 2016-02-10 MED ORDER — AMOXICILLIN-POT CLAVULANATE 875-125 MG PO TABS: 1.0000 | ORAL_TABLET | Freq: Two times a day (BID) | ORAL | 0 refills | Status: DC

## 2016-02-10 MED ORDER — PROMETHAZINE HCL 25 MG/ML IJ SOLN
25.0000 mg | Freq: Once | INTRAMUSCULAR | Status: AC
  Administered 2016-02-10: 25 mg via INTRAMUSCULAR

## 2016-02-10 MED ORDER — OSELTAMIVIR PHOSPHATE 75 MG PO CAPS: 75.0000 mg | ORAL_CAPSULE | Freq: Two times a day (BID) | ORAL | 0 refills | Status: DC

## 2016-02-10 NOTE — Patient Instructions (Signed)
Follow up if no improvement 

## 2016-02-10 NOTE — Progress Notes (Signed)
BP 131/85 (BP Location: Left Arm, Patient Position: Sitting, Cuff Size: Normal)   Pulse (!) 138   Temp 98.9 F (37.2 C)   Ht 5\' 3"  (1.6 m)   Wt 100 lb (45.4 kg)   LMP 01/31/2016   SpO2 100%   BMI 17.71 kg/m    Subjective:    Patient ID: 14/06/2015, female    DOB: Dec 28, 1975, 40 y.o.   MRN: [id number]  HPI: Rebekah Martin is a 40 y.o. female  Chief Complaint  Patient presents with  . URI   Patient presents with over a week of nasal congestion, ear pain, productive cough, sore throat, and fatigue. Started suddenly 2 days ago with vomiting, severe body aches and chills, low grade fevers, and now diarrhea. Taking cough drops, theraflu, and mucinex with no relief. 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  Past Medical History:  Diagnosis Date  . Allergy   . Anxiety   . Behcet's disease (HCC)   . Fibromyalgia   . Migraine   . Mitral valve prolapse   . Positive ANA (antinuclear antibody)   . RA (rheumatoid arthritis) (HCC)   . Rheumatoid arthritis (HCC)   . Tachycardia    Social History   Social History  . Marital status: Married    Spouse name: N/A  . Number of children: N/A  . Years of education: N/A   Occupational History  . Not on file.   Social History Main Topics  . Smoking status: Never Smoker  . Smokeless tobacco: Never Used  . Alcohol use Yes     Comment: on occasion  . Drug use: No  . Sexual activity: Yes    Birth control/ protection: None   Other Topics Concern  . Not on file   Social History Narrative  . No narrative on file    Relevant past medical, surgical, family and social history reviewed and updated as indicated. Interim medical history since our last visit reviewed. Allergies and medications reviewed and updated.  Review of Systems  Constitutional: Positive for chills, diaphoresis and fatigue. Negative for fever.  HENT:  Positive for congestion, ear pain and sore throat.   Eyes: Negative.   Respiratory: Positive for cough. Negative for chest tightness, shortness of breath and wheezing.   Cardiovascular: Negative.   Gastrointestinal: Positive for diarrhea, nausea and vomiting. Negative for abdominal pain.  Genitourinary: Negative.   Musculoskeletal: Positive for arthralgias and myalgias.  Skin: Negative.   Neurological: Negative.   Psychiatric/Behavioral: Negative.     Per HPI unless specifically indicated above     Objective:    BP 131/85 (BP Location: Left Arm, Patient Position: Sitting, Cuff Size: Normal)   Pulse (!) 138   Temp 98.9 F (37.2 C)   Ht 5\' 3"  (1.6 m)   Wt 100 lb (45.4 kg)   LMP 01/31/2016   SpO2 100%   BMI 17.71 kg/m   Wt Readings from Last 3 Encounters:  02/10/16 100 lb (45.4 kg)  02/07/16 100 lb (45.4 kg)  10/18/14 100 lb (45.4 kg)    Physical Exam  Constitutional: She is oriented to person, place, and time. She appears well-developed and well-nourished.  HENT:  Head: Atraumatic.  Right Ear: External ear normal.  Left Ear: External ear normal.  B/l TMs with mild effusion Nasal mucosa injected Oropharynx erythematous  Neck: Normal range of motion. Neck supple.  Cardiovascular:  Tachycardic  Pulmonary/Chest: Effort normal and breath sounds normal. No respiratory distress.  Abdominal: Soft. Bowel sounds are normal.  Musculoskeletal: Normal range of motion.  Neurological: She is alert and oriented to person, place, and time.  Skin: Skin is warm and dry.  Psychiatric: She has a normal mood and affect. Her behavior is normal.  Nursing note and vitals reviewed.   No results found for this or any previous visit.    Assessment & Plan:   Problem List Items Addressed This Visit    None    Visit Diagnoses    Flu-like symptoms    -  Primary   Rapid flu negative, but given immune status will treat with tamiflu. Will also send augmentin as probable bacterial URI under  new onset viral sxs.    Relevant Medications   promethazine (PHENERGAN) injection 25 mg (Completed)   Other Relevant Orders   Veritor Flu A/B Waived   Nausea and vomiting, intractability of vomiting not specified, unspecified vomiting type       IM phenergan given today. Imodium and zofran at home as needed. Follow up if no improvement   Relevant Medications   promethazine (PHENERGAN) injection 25 mg (Completed)       Follow up plan: Return if symptoms worsen or fail to improve.

## 2016-02-13 LAB — TOXASSURE SELECT 13 (MW), URINE

## 2016-02-15 ENCOUNTER — Ambulatory Visit
Admission: RE | Admit: 2016-02-15 | Discharge: 2016-02-15 | Disposition: A | Discharge status: Home / Self Care | Payer: 59 | Source: Ambulatory Visit | Attending: Family Medicine | Admitting: Family Medicine

## 2016-02-15 ENCOUNTER — Other Ambulatory Visit: Payer: Self-pay | Admitting: Family Medicine

## 2016-02-15 ENCOUNTER — Encounter: Payer: Self-pay | Admitting: Family Medicine

## 2016-02-15 ENCOUNTER — Ambulatory Visit (INDEPENDENT_AMBULATORY_CARE_PROVIDER_SITE_OTHER): Payer: 59 | Admitting: Family Medicine

## 2016-02-15 VITALS — BP 137/94 | HR 147 | Temp 97.7°F | Ht 63.0 in | Wt 100.0 lb

## 2016-02-15 DIAGNOSIS — J069 Acute upper respiratory infection, unspecified: Secondary | ICD-10-CM

## 2016-02-15 DIAGNOSIS — R05 Cough: Secondary | ICD-10-CM | POA: Insufficient documentation

## 2016-02-15 DIAGNOSIS — R059 Cough, unspecified: Secondary | ICD-10-CM

## 2016-02-15 DIAGNOSIS — R918 Other nonspecific abnormal finding of lung field: Secondary | ICD-10-CM | POA: Insufficient documentation

## 2016-02-15 MED ORDER — ALBUTEROL SULFATE (2.5 MG/3ML) 0.083% IN NEBU: 2.5000 mg | INHALATION_SOLUTION | Freq: Once | RESPIRATORY_TRACT | Status: AC

## 2016-02-15 MED ORDER — HYDROCOD POLST-CPM POLST ER 10-8 MG/5ML PO SUER: 5.0000 mL | Freq: Two times a day (BID) | ORAL | 0 refills | Status: DC | PRN

## 2016-02-15 MED ORDER — ALBUTEROL SULFATE HFA 108 (90 BASE) MCG/ACT IN AERS: 2.0000 | INHALATION_SPRAY | Freq: Four times a day (QID) | RESPIRATORY_TRACT | 0 refills | Status: DC | PRN

## 2016-02-15 MED ORDER — AZITHROMYCIN 250 MG PO TABS: ORAL_TABLET | ORAL | 0 refills | Status: DC

## 2016-02-15 MED ORDER — TRIAMCINOLONE ACETONIDE 40 MG/ML IJ SUSP
40.0000 mg | Freq: Once | INTRAMUSCULAR | Status: AC
  Administered 2016-02-15: 40 mg via INTRAMUSCULAR

## 2016-02-15 NOTE — Progress Notes (Signed)
BP (!) 137/94 (BP Location: Left Arm, Patient Position: Sitting, Cuff Size: Small)   Pulse (!) 147   Temp 97.7 F (36.5 C)   Ht 5\' 3"  (1.6 m)   Wt 100 lb (45.4 kg)   LMP 01/31/2016   SpO2 100%   BMI 17.71 kg/m    Subjective:    Patient ID: 14/06/2015, female    DOB: 11/08/1975, 40 y.o.   MRN: [id number]  HPI: Rebekah Martin is a 40 y.o. female  Chief Complaint  Patient presents with  . Follow-up   Patient presents for worsening URI symptoms since she was in last week. States she is getting some improvement as far as nasal congestion and sinus pressure, but now feels like things are settling into her chest. Has been feeling intermittently SOB, and cough has become much worse and is constant. Finishing a course of augmentin with minimal improvement. Denies fever, chills, aches at this point.   Relevant past medical, surgical, family and social history reviewed and updated as indicated. Interim medical history since our last visit reviewed. Allergies and medications reviewed and updated.  Review of Systems  Constitutional: Negative.   HENT: Positive for congestion and sore throat.   Eyes: Negative.   Respiratory: Positive for cough, chest tightness and shortness of breath.   Cardiovascular: Negative.   Gastrointestinal: Positive for nausea.  Genitourinary: Negative.   Musculoskeletal: Negative.   Skin: Negative.   Neurological: Negative.   Psychiatric/Behavioral: Negative.     Per HPI unless specifically indicated above     Objective:    BP (!) 137/94 (BP Location: Left Arm, Patient Position: Sitting, Cuff Size: Small)   Pulse (!) 147   Temp 97.7 F (36.5 C)   Ht 5\' 3"  (1.6 m)   Wt 100 lb (45.4 kg)   LMP 01/31/2016   SpO2 100%   BMI 17.71 kg/m   Wt Readings from Last 3 Encounters:  02/15/16 100 lb (45.4 kg)  02/10/16 100 lb (45.4 kg)  02/07/16 100 lb (45.4 kg)    Physical Exam  Constitutional: She is oriented to person, place, and time. She appears  well-developed and well-nourished.  HENT:  Head: Atraumatic.  Right Ear: External ear normal.  Left Ear: External ear normal.  Nose: Nose normal.  Mouth/Throat: Oropharynx is clear and moist. No oropharyngeal exudate.  Eyes: Conjunctivae are normal. Pupils are equal, round, and reactive to light.  Neck: Normal range of motion. Neck supple.  Cardiovascular: Normal rate, regular rhythm and normal heart sounds.   Pulmonary/Chest: Effort normal. No respiratory distress. She has wheezes (mild wheezes). She has no rales.  Musculoskeletal: Normal range of motion.  Neurological: She is alert and oriented to person, place, and time.  Skin: Skin is warm and dry.  Psychiatric: She has a normal mood and affect. Her behavior is normal.  Nursing note and vitals reviewed.   Results for orders placed or performed in visit on 02/10/16  Veritor Flu A/B Waived  Result Value Ref Range   Influenza A Negative Negative   Influenza B Negative Negative      Assessment & Plan:   Problem List Items Addressed This Visit    None    Visit Diagnoses    Upper respiratory tract infection, unspecified type    -  Primary   Await CXR result, but no evidence of pneumonia on exam. IM Kenalog and albuterol neb given today. Will treat with z-pak, tessalon, and tussionex   Relevant Medications  triamcinolone acetonide (KENALOG-40) injection 40 mg (Completed)   azithromycin (ZITHROMAX) 250 MG tablet   albuterol (PROVENTIL) (2.5 MG/3ML) 0.083% nebulizer solution 2.5 mg       Follow up plan: Return if symptoms worsen or fail to improve.

## 2016-02-15 NOTE — Patient Instructions (Signed)
Follow up if worsening or no improvement 

## 2016-02-24 MED FILL — HUMIRA PEN 40 MG/0.8ML PNKT: 40 | 28 days supply | Qty: 2 | Fill #6

## 2016-03-05 ENCOUNTER — Encounter: Payer: Self-pay | Admitting: Anesthesiology

## 2016-03-05 ENCOUNTER — Ambulatory Visit: Payer: 59 | Attending: Anesthesiology | Admitting: Anesthesiology

## 2016-03-05 VITALS — BP 125/73 | HR 143 | Temp 97.5°F | Resp 18 | Ht 63.0 in | Wt 95.0 lb

## 2016-03-05 DIAGNOSIS — M06031 Rheumatoid arthritis without rheumatoid factor, right wrist: Secondary | ICD-10-CM

## 2016-03-05 DIAGNOSIS — M352 Behcet's disease: Secondary | ICD-10-CM | POA: Insufficient documentation

## 2016-03-05 DIAGNOSIS — M069 Rheumatoid arthritis, unspecified: Secondary | ICD-10-CM | POA: Insufficient documentation

## 2016-03-05 DIAGNOSIS — M25562 Pain in left knee: Secondary | ICD-10-CM | POA: Insufficient documentation

## 2016-03-05 DIAGNOSIS — M797 Fibromyalgia: Secondary | ICD-10-CM | POA: Insufficient documentation

## 2016-03-05 DIAGNOSIS — M25561 Pain in right knee: Secondary | ICD-10-CM | POA: Diagnosis not present

## 2016-03-05 DIAGNOSIS — K1379 Other lesions of oral mucosa: Secondary | ICD-10-CM | POA: Insufficient documentation

## 2016-03-05 DIAGNOSIS — M06032 Rheumatoid arthritis without rheumatoid factor, left wrist: Secondary | ICD-10-CM

## 2016-03-05 DIAGNOSIS — G894 Chronic pain syndrome: Secondary | ICD-10-CM | POA: Diagnosis not present

## 2016-03-05 DIAGNOSIS — Z79899 Other long term (current) drug therapy: Secondary | ICD-10-CM | POA: Insufficient documentation

## 2016-03-05 MED ORDER — TRAMADOL HCL 50 MG PO TABS: 50.0000 mg | ORAL_TABLET | Freq: Two times a day (BID) | ORAL | 2 refills | Status: DC

## 2016-03-05 NOTE — Progress Notes (Signed)
Safety precautions to be maintained throughout the outpatient stay will include: orient to surroundings, keep bed in low position, maintain call bell within reach at all times, provide assistance with transfer out of bed and ambulation.  

## 2016-03-06 NOTE — Progress Notes (Signed)
Subjective:  Patient ID: Rebekah Martin, female    DOB: Jan 06, 1976  Age: 41 y.o. MRN: [id number]  CC: Knee Pain (bilateral) and Oral Pain (ulcers)   Service Provided on Last Visit: Med Refill, Evaluation  PROCEDURE:None  HPI Rebekah Martin presents for reevaluation. She was last seen 1 month ago. She has been taking her Ultram as prescribed and based on her assessment sheet this continues to give her good relief. The quality characteristic and distribution of her pain remains stable in nature. She has had a recent flu virus that this has resolved. Otherwise she continues to have pain in the knees and ankles with periodic oral ulcerations. The Ultram manages the pain from these conditions well and she denies any significant side effects with it.   By history she presents via referral from Dr. Willette Pa at [facility] rheumatology. 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 She also has a hx of Behcets disease. She describes diffuse body pain it's been present for 17 years. It affects her shoulders and low back wrists knees calves and shins ankles and feet. She has been on tramadol taking this 2 times a day which helps and Mobic which helps. Unfortunately she also gets occasional oral ulcerations and these are ineffective for that. She has taken local anesthetic topical last lotion which is helped with the oral ulcerations when they occur. Her joint pain is persistent and has  gradually gotten worse. She describes a pain that is present morning and night during exercise and after exercise and aggravated by bending climbing kneeling lifting motion in sitting. Alleviating factors include warm Martin and medication management sleep rest walking cold and hot application. The pain is sharp tender throbbing tiring and located in most joints. It is disabling. She has failed conservative therapy with simple anti-inflammatory and Tylenol and has been on tramadol with good  success and no significant side effects. She has never had any injection therapy for her back pain. She has been on oral steroids. Tramadol yields 80% improvement in her pain. She denies numbness or tingling affecting the lower extremities.  History Rebekah Martin has a past medical history of Allergy; Anxiety; Behcet's disease (HCC); Fibromyalgia; Migraine; Mitral valve prolapse; Positive ANA (antinuclear antibody); RA (rheumatoid arthritis) (HCC); Rheumatoid arthritis (HCC); and Tachycardia.   She has a past surgical history that includes Laparoscopic abdominal exploration; Mandible fracture surgery; Wisdom tooth extraction; Hernia repair; and laparoscopy (2010).   Her family history includes Alzheimer's disease in her paternal grandmother; COPD in her paternal grandfather; Cancer in her maternal grandfather; Hyperlipidemia in her mother; Hypertension in her father, maternal grandmother, and mother; Lung disease in her maternal grandmother.She reports that she has never smoked. She has never used smokeless tobacco. She reports that she drinks alcohol. She reports that she does not use drugs.  No results found for this or any previous visit.  Tox[employer]ure Select 13  Date Value Ref Range Status  02/07/2016 FINAL  Final    Comment:    ==================================================================== TOXASSURE SELECT 13 (MW) ==================================================================== Test                             Result       Flag       Units Drug Present and Declared for Prescription Verification   Tramadol  PRESENT      EXPECTED   O-Desmethyltramadol            PRESENT      EXPECTED   N-Desmethyltramadol            PRESENT      EXPECTED    Source of tramadol is a prescription medication.    O-desmethyltramadol and N-desmethyltramadol are expected    metabolites of tramadol. Drug Absent but Declared for Prescription Verification   Clonazepam                     Not  Detected UNEXPECTED ng/mg creat ==================================================================== Test                      Result    Flag   Units      Ref Range   Creatinine              63               mg/dL      >=16 ==================================================================== Declared Medications:  The flagging and interpretation on this report are based on the  following declared medications.  Unexpected results may arise from  inaccuracies in the declared medications.  **Note: The testing scope of this panel includes these medications:  Clonazepam (Klonopin)  Tramadol (Ultram)  **Note: The testing scope of this panel does not include following  reported medications:  Adalimumab  Calcium Carbonate (Calcarb with Vitamin D)  Docusate (Colace)  Duloxetine (Cymbalta)  Hydroxychloroquine (Plaquenil)  Meloxicam (Mobic)  Nystatin (Mycostatin)  Ondansetron (Zofran)  Prednisone (Deltasone)  Propranolol (Inderal)  Ranitidine (Zantac)  Sulfasalazine (Azulfidine)  Sumatriptan (Imitrex)  Triamcinolone (Kenalog)  Vitamin D (Calcarb with Vitamin D) ==================================================================== For clinical consultation, please call [phone]. ====================================================================     Outpatient Medications Prior to Visit  Medication Sig Dispense Refill  . Adalimumab (HUMIRA PEN) 40 MG/0.8ML PNKT Inject 40 mg into the skin every 14 (fourteen) days. 2 each 6  . albuterol (PROVENTIL HFA;VENTOLIN HFA) 108 (90 Base) MCG/ACT inhaler Inhale 2 puffs into the lungs every 6 (six) hours as needed for wheezing or shortness of breath. 1 Inhaler 0  . Calcium Carbonate-Vit D-Min (CALCIUM 600 + MINERALS PO) Take 600 mg by mouth 2 (two) times daily.    . chlorpheniramine-HYDROcodone (TUSSIONEX PENNKINETIC ER) 10-8 MG/5ML SUER Take 5 mLs by mouth every 12 (twelve) hours as needed for cough. 115 mL 0  . docusate sodium (STOOL  SOFTENER) 100 MG capsule Take 100 mg by mouth 2 (two) times daily.    . DULoxetine (CYMBALTA) 30 MG capsule Take by mouth.    . Esomeprazole Magnesium (NEXIUM PO) Take 22.3 mg by mouth daily.    . hydroxychloroquine (PLAQUENIL) 200 MG tablet Take by mouth.    . meloxicam (MOBIC) 15 MG tablet Take 15 mg by mouth daily as needed for pain.    . Multiple Vitamins-Minerals (MULTIVITAMIN ADULT PO) Take by mouth daily.    . ondansetron (ZOFRAN) 4 MG tablet     . sulfaSALAzine (AZULFIDINE) 500 MG EC tablet Take 1,500 mg by mouth daily.   3  . SUMAtriptan (IMITREX) 25 MG tablet Take by mouth.    . triamcinolone (KENALOG) 0.1 % paste Apply to affected area(s) 2 times per day as needed    . traMADol (ULTRAM) 50 MG tablet Take by mouth.    Marland Kitchen amoxicillin-clavulanate (AUGMENTIN) 875-125 MG tablet Take 1 tablet by mouth 2 (two) times daily. (Patient  not taking: Reported on 03/05/2016) 14 tablet 0  . azithromycin (ZITHROMAX) 250 MG tablet Take 2 tablets day one, then 1 tablet daily (Patient not taking: Reported on 03/05/2016) 6 tablet 0  . clonazePAM (KLONOPIN) 0.5 MG tablet as needed.    . nystatin (MYCOSTATIN) 100000 UNIT/ML suspension 5 ml swish and swallow 4 times a day as needed    . propranolol (INDERAL) 10 MG tablet Take 10 mg by mouth 4 (four) times daily as needed.    . ranitidine (ZANTAC) 150 MG tablet Take 150 mg by mouth daily.     Facility-Administered Medications Prior to Visit  Medication Dose Route Frequency Provider Last Rate Last Dose  . albuterol (PROVENTIL) (2.5 MG/3ML) 0.083% nebulizer solution 2.5 mg  2.5 mg Nebulization Once [employer], PA-C       No results found for: WBC, HGB, HCT, PLT, GLUCOSE, CHOL, TRIG, HDL, LDLDIRECT, LDLCALC, ALT, AST, NA, K, CL, CREATININE, BUN, CO2, TSH, PSA, INR, GLUF, HGBA1C, MICROALBUR  --------------------------------------------------------------------------------------------------------------------- Dg Knee Complete 4 Views Right  Result Date:  07/08/2012 * PRIOR REPORT IMPORTED FROM AN EXTERNAL SYSTEM * PRIOR REPORT IMPORTED FROM THE SYNGO WORKFLOW SYSTEM REASON FOR EXAM:    right knee pain 1 week COMMENTS: PROCEDURE:     GDR - GDR KNEE RT COMP WITH OBLIQUES  - Jul 08 2012 12:51PM RESULT:     Images of the right knee demonstrate no definite fracture, dislocation or radiopaque foreign body. IMPRESSION:      Please see above. Dictation Site: 2        ---------------------------------------------------------------------------------------------------------------------- Past Medical History:  Diagnosis Date  . Allergy   . Anxiety   . Behcet's disease (HCC)   . Fibromyalgia   . Migraine   . Mitral valve prolapse   . Positive ANA (antinuclear antibody)   . RA (rheumatoid arthritis) (HCC)   . Rheumatoid arthritis (HCC)   . Tachycardia     Past Surgical History:  Procedure Laterality Date  . HERNIA REPAIR     age 35  . LAPAROSCOPIC ABDOMINAL EXPLORATION     X 2  . LAPAROSCOPY  2010   ovarian cyst and endometriosis  . MANDIBLE FRACTURE SURGERY    . WISDOM TOOTH EXTRACTION      Family History  Problem Relation Age of Onset  . Hypertension Mother   . Hyperlipidemia Mother   . Hypertension Father   . Hypertension Maternal Grandmother   . Lung disease Maternal Grandmother   . Cancer Maternal Grandfather     melanoma  . Alzheimer's disease Paternal Grandmother   . COPD Paternal Grandfather     Social History  Substance Use Topics  . Smoking status: Never Smoker  . Smokeless tobacco: Never Used  . Alcohol use Yes     Comment: on occasion    ---------------------------------------------------------------------------------------------------------------------- Social History   Social History  . Marital status: Married    Spouse name: N/A  . Number of children: N/A  . Years of education: N/A   Social History Main Topics  . Smoking status: Never Smoker  . Smokeless tobacco: Never Used  . Alcohol use Yes      Comment: on occasion  . Drug use: No  . Sexual activity: Yes    Birth control/ protection: None   Other Topics Concern  . None   Social History Narrative  . None    Scheduled Meds: Continuous Infusions: PRN Meds:.   BP 125/73   Pulse (!) 143   Temp 97.5 F (36.4  C) (Oral)   Resp 18   Ht 5\' 3"  (1.6 m)   Wt 95 lb (43.1 kg)   LMP 03/03/2016 (Exact Date)   SpO2 100%   BMI 16.83 kg/m    BP Readings from Last 3 Encounters:  03/05/16 125/73  02/15/16 (!) 137/94  02/10/16 131/85     Wt Readings from Last 3 Encounters:  03/05/16 95 lb (43.1 kg)  02/15/16 100 lb (45.4 kg)  02/10/16 100 lb (45.4 kg)     ----------------------------------------------------------------------------------------------------------------------  ROS Review of Systems  Cardiac: No angina GI: No constipation  Objective:  BP 125/73   Pulse (!) 143   Temp 97.5 F (36.4 C) (Oral)   Resp 18   Ht 5\' 3"  (1.6 m)   Wt 95 lb (43.1 kg)   LMP 03/03/2016 (Exact Date)   SpO2 100%   BMI 16.83 kg/m   Physical Exam  Patient is alert oriented cooperative compliant Pupils are equally round reactive to light Heart is regular rate and rhythm without murmur Lungs are clear to also dictation without wheeze   Assessment & Plan:   Rheana was seen today for knee pain and oral pain.  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 Fibromyalgia syndrome I think it's reasonable for her to continue with the tramadol as this is well tolerated and of her low risk profile. She has Refill medications to get her until her next appointment in 2 months - ToxASSURE Select 13 (MW), Urine  2. 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 Chronic pain syndrome As above   4. Behcet's syndrome (HCC)  In reference to the lower extremity weakness on flexion at the knees this has  been stable but should she note any changes, nerve conduction studies may be warranted.    ----------------------------------------------------------------------------------------------------------------------  I have changed Ms. Roarty's traMADol. I am also having her maintain her ranitidine, meloxicam, propranolol, Calcium Carbonate-Vit D-Min (CALCIUM 600 + MINERALS PO), docusate sodium, Multiple Vitamins-Minerals (MULTIVITAMIN ADULT PO), Adalimumab, hydroxychloroquine, nystatin, sulfaSALAzine, clonazePAM, DULoxetine, ondansetron, SUMAtriptan, triamcinolone, Esomeprazole Magnesium (NEXIUM PO), amoxicillin-clavulanate, azithromycin, albuterol, and chlorpheniramine-HYDROcodone. We will continue to administer albuterol.   Meds ordered this encounter  Medications  . traMADol (ULTRAM) 50 MG tablet    Sig: Take 1 tablet (50 mg total) by mouth 2 (two) times daily.    Dispense:  60 tablet    Refill:  2       Follow-up: Return in about 2 months (around 05/03/2016) for evaluation, med refill.    [name], MD  The  practitioner database for opioid medications on  this patient has been reviewed by me and my  staff   Greater than 50% of the total encounter time was spent in counseling and / or coordination of care.     This dictation was performed utilizing Conservation officer, historic buildings.  Please excuse any unintentional or mistaken typographical errors as a result.

## 2016-04-05 ENCOUNTER — Other Ambulatory Visit: Payer: Self-pay | Admitting: Pharmacist

## 2016-04-05 MED ORDER — ADALIMUMAB 40 MG/0.8ML ~~LOC~~ AJKT: 0.8000 mL | AUTO-INJECTOR | SUBCUTANEOUS | 11 refills | Status: DC

## 2016-04-05 MED FILL — HUMIRA PEN 40 MG/0.8ML PNKT: 40 | 28 days supply | Qty: 2 | Fill #0

## 2016-05-09 MED FILL — HUMIRA PEN 40 MG/0.8ML PNKT: 40 | 28 days supply | Qty: 2 | Fill #1

## 2016-05-24 ENCOUNTER — Other Ambulatory Visit: Payer: Self-pay | Admitting: Family Medicine

## 2016-05-24 MED ORDER — CLOBETASOL PROPIONATE 0.05 % EX CREA: 1.0000 "application " | TOPICAL_CREAM | Freq: Two times a day (BID) | CUTANEOUS | 1 refills | Status: DC

## 2016-05-31 ENCOUNTER — Other Ambulatory Visit: Payer: Self-pay | Admitting: Family Medicine

## 2016-05-31 MED ORDER — ONDANSETRON HCL 4 MG PO TABS: 4.0000 mg | ORAL_TABLET | Freq: Three times a day (TID) | ORAL | 0 refills | Status: DC | PRN

## 2016-06-25 ENCOUNTER — Ambulatory Visit: Payer: 59 | Admitting: Anesthesiology

## 2016-07-06 ENCOUNTER — Ambulatory Visit: Payer: 59 | Attending: Anesthesiology | Admitting: Anesthesiology

## 2016-07-06 ENCOUNTER — Encounter: Payer: Self-pay | Admitting: Anesthesiology

## 2016-07-06 VITALS — BP 114/75 | HR 141 | Temp 98.3°F | Resp 18 | Ht 63.0 in | Wt 95.0 lb

## 2016-07-06 DIAGNOSIS — M069 Rheumatoid arthritis, unspecified: Secondary | ICD-10-CM | POA: Insufficient documentation

## 2016-07-06 DIAGNOSIS — M25531 Pain in right wrist: Secondary | ICD-10-CM | POA: Insufficient documentation

## 2016-07-06 DIAGNOSIS — M06032 Rheumatoid arthritis without rheumatoid factor, left wrist: Secondary | ICD-10-CM | POA: Diagnosis not present

## 2016-07-06 DIAGNOSIS — M06031 Rheumatoid arthritis without rheumatoid factor, right wrist: Secondary | ICD-10-CM | POA: Diagnosis not present

## 2016-07-06 DIAGNOSIS — G894 Chronic pain syndrome: Secondary | ICD-10-CM | POA: Diagnosis not present

## 2016-07-06 DIAGNOSIS — M797 Fibromyalgia: Secondary | ICD-10-CM | POA: Diagnosis not present

## 2016-07-06 MED ORDER — TRAMADOL HCL 50 MG PO TABS: 50.0000 mg | ORAL_TABLET | Freq: Four times a day (QID) | ORAL | 2 refills | Status: DC | PRN

## 2016-07-06 NOTE — Patient Instructions (Signed)
Prescription given tramadol with 2 refills.  To return for med refill in 3 months.

## 2016-07-06 NOTE — Progress Notes (Signed)
Nursing Pain Medication Assessment:  Safety precautions to be maintained throughout the outpatient stay will include: orient to surroundings, keep bed in low position, maintain call bell within reach at all times, provide assistance with transfer out of bed and ambulation.  Medication Inspection Compliance: Pill count conducted under aseptic conditions, in front of the patient. Neither the pills nor the bottle was removed from the patient's sight at any time. Once count was completed pills were immediately returned to the patient in their original bottle.  Medication: Tramadol (Ultram) Pill/Patch Count: 19 of 60 pills remain Pill/Patch Appearance: Markings consistent with prescribed medication Bottle Appearance: Standard pharmacy container. Clearly labeled. Filled Date:03 / 28 / 2018 Last Medication intake:  Yesterday

## 2016-07-10 DIAGNOSIS — M352 Behcet's disease: Secondary | ICD-10-CM | POA: Diagnosis not present

## 2016-07-10 DIAGNOSIS — Z79899 Other long term (current) drug therapy: Secondary | ICD-10-CM | POA: Diagnosis not present

## 2016-07-10 DIAGNOSIS — M797 Fibromyalgia: Secondary | ICD-10-CM | POA: Diagnosis not present

## 2016-07-10 DIAGNOSIS — R768 Other specified abnormal immunological findings in serum: Secondary | ICD-10-CM | POA: Diagnosis not present

## 2016-07-10 DIAGNOSIS — M810 Age-related osteoporosis without current pathological fracture: Secondary | ICD-10-CM | POA: Diagnosis not present

## 2016-07-10 DIAGNOSIS — M0609 Rheumatoid arthritis without rheumatoid factor, multiple sites: Secondary | ICD-10-CM | POA: Diagnosis not present

## 2016-07-11 NOTE — Progress Notes (Signed)
Subjective:  Patient ID: Rebekah Martin, female    DOB: [birth year]  Age: 41 y.o. MRN: [id number]  CC: Wrist Pain (right)   Service Provided on Last Visit: Med Refill  PROCEDURE:None  HPI Rebekah Martin was last seen in January. The quality characteristic and distribution of her body pain knee pain and joint pain is stable in nature. She denies any change in upper or lower extremity strength. The pain continues towax and wane throughout the day and generally responds favorably to Ultram. She's been taking this as prescribed and it works well for her with minimal side effects. Based on her narcotic assessment sheet today she is doing well with the medication. Otherwise no change in lower extremity strength or upper shoulder strength or function is noted. She continues to try and do her exercises as tolerated.   By history she presents via referral from Dr. Willette Pa at [facility] rheumatology. 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 She also has a hx of Behcets disease. She describes diffuse body pain it's been present for 17 years. It affects her shoulders and low back wrists knees calves and shins ankles and feet. She has been on tramadol taking this 2 times a day which helps and Mobic which helps. Unfortunately she also gets occasional oral ulcerations and these are ineffective for that. She has taken local anesthetic topical last lotion which is helped with the oral ulcerations when they occur. Her joint pain is persistent and has  gradually gotten worse. She describes a pain that is present morning and night during exercise and after exercise and aggravated by bending climbing kneeling lifting motion in sitting. Alleviating factors include warm Martin and medication management sleep rest walking cold and hot application. The pain is sharp tender throbbing tiring and located in most joints. It is disabling. She has failed conservative therapy with simple  anti-inflammatory and Tylenol and has been on tramadol with good success and no significant side effects. She has never had any injection therapy for her back pain. She has been on oral steroids. Tramadol yields 80% improvement in her pain. She denies numbness or tingling affecting the lower extremities.  History Rebekah Martin has a past medical history of Allergy; Anxiety; Behcet's disease (HCC); Fibromyalgia; Migraine; Mitral valve prolapse; Positive ANA (antinuclear antibody); RA (rheumatoid arthritis) (HCC); Rheumatoid arthritis (HCC); and Tachycardia.   She has a past surgical history that includes Laparoscopic abdominal exploration; Mandible fracture surgery; Wisdom tooth extraction; Hernia repair; and laparoscopy (2010).   Her family history includes Alzheimer's disease in her paternal grandmother; COPD in her paternal grandfather; Cancer in her maternal grandfather; Hyperlipidemia in her mother; Hypertension in her father, maternal grandmother, and mother; Lung disease in her maternal grandmother.She reports that she has never smoked. She has never used smokeless tobacco. She reports that she drinks alcohol. She reports that she does not use drugs.  No results found for this or any previous visit.  [facility]ox[facility] Select 13  Date Value Ref Range Status  02/07/2016 FINAL  Final    Comment:    ==================================================================== TOX[facility] SELECT 13 (MW) ==================================================================== Test                             Result       Flag       Units Drug Present and Declared for Prescription Verification   Tramadol  PRESENT      EXPECTED   O-Desmethyltramadol            PRESENT      EXPECTED   N-Desmethyltramadol            PRESENT      EXPECTED    Source of tramadol is a prescription medication.    O-desmethyltramadol and N-desmethyltramadol are expected    metabolites of tramadol. Drug Absent but Declared for  Prescription Verification   Clonazepam                     Not Detected UNEXPECTED ng/mg creat ==================================================================== Test                      Result    Flag   Units      Ref Range   Creatinine              63               mg/dL      >=81 ==================================================================== Declared Medications:  The flagging and interpretation on this report are based on the  following declared medications.  Unexpected results may arise from  inaccuracies in the declared medications.  **Note: The testing scope of this panel includes these medications:  Clonazepam (Klonopin)  Tramadol (Ultram)  **Note: The testing scope of this panel does not include following  reported medications:  Adalimumab  Calcium Carbonate (Calcarb with Vitamin D)  Docusate (Colace)  Duloxetine (Cymbalta)  Hydroxychloroquine (Plaquenil)  Meloxicam (Mobic)  Nystatin (Mycostatin)  Ondansetron (Zofran)  Prednisone (Deltasone)  Propranolol (Inderal)  Ranitidine (Zantac)  Sulfasalazine (Azulfidine)  Sumatriptan (Imitrex)  Triamcinolone (Kenalog)  Vitamin D (Calcarb with Vitamin D) ==================================================================== For clinical consultation, please call [phone]. ====================================================================     Outpatient Medications Prior to Visit  Medication Sig Dispense Refill  . Adalimumab (HUMIRA PEN) 40 MG/0.8ML PNKT Inject 0.8 mLs into the skin every 14 (fourteen) days. 1 each 11  . albuterol (PROVENTIL HFA;VENTOLIN HFA) 108 (90 Base) MCG/ACT inhaler Inhale 2 puffs into the lungs every 6 (six) hours as needed for wheezing or shortness of breath. 1 Inhaler 0  . Calcium Carbonate-Vit D-Min (CALCIUM 600 + MINERALS PO) Take 600 mg by mouth 2 (two) times daily.    . clobetasol cream (TEMOVATE) 0.05 % Apply 1 application topically 2 (two) times daily. 60 g 1  . docusate sodium  (STOOL SOFTENER) 100 MG capsule Take 100 mg by mouth 2 (two) times daily.    . DULoxetine (CYMBALTA) 30 MG capsule Take by mouth.    . Esomeprazole Magnesium (NEXIUM PO) Take 22.3 mg by mouth daily.    . meloxicam (MOBIC) 15 MG tablet Take 15 mg by mouth daily as needed for pain.    . Multiple Vitamins-Minerals (MULTIVITAMIN ADULT PO) Take by mouth daily.    . ondansetron (ZOFRAN) 4 MG tablet Take 1 tablet (4 mg total) by mouth every 8 (eight) hours as needed for nausea or vomiting. 30 tablet 0  . sulfaSALAzine (AZULFIDINE) 500 MG EC tablet Take 1,500 mg by mouth daily.   3  . SUMAtriptan (IMITREX) 25 MG tablet Take by mouth.    . triamcinolone (KENALOG) 0.1 % paste Apply to affected area(s) 2 times per day as needed    . traMADol (ULTRAM) 50 MG tablet Take 1 tablet (50 mg total) by mouth 2 (two) times daily. 60 tablet 2  . amoxicillin-clavulanate (AUGMENTIN) 875-125 MG tablet Take  1 tablet by mouth 2 (two) times daily. (Patient not taking: Reported on 03/05/2016) 14 tablet 0  . azithromycin (ZITHROMAX) 250 MG tablet Take 2 tablets day one, then 1 tablet daily (Patient not taking: Reported on 03/05/2016) 6 tablet 0  . chlorpheniramine-HYDROcodone (TUSSIONEX PENNKINETIC ER) 10-8 MG/5ML SUER Take 5 mLs by mouth every 12 (twelve) hours as needed for cough. (Patient not taking: Reported on 07/06/2016) 115 mL 0  . clonazePAM (KLONOPIN) 0.5 MG tablet as needed.    . nystatin (MYCOSTATIN) 100000 UNIT/ML suspension 5 ml swish and swallow 4 times a day as needed    . propranolol (INDERAL) 10 MG tablet Take 10 mg by mouth 4 (four) times daily as needed.    . ranitidine (ZANTAC) 150 MG tablet Take 150 mg by mouth daily.     Facility-Administered Medications Prior to Visit  Medication Dose Route Frequency Provider Last Rate Last Dose  . albuterol (PROVENTIL) (2.5 MG/3ML) 0.083% nebulizer solution 2.5 mg  2.5 mg Nebulization Once Particia Nearing, PA-C       No results found for: WBC, HGB, HCT, PLT,  GLUCOSE, CHOL, TRIG, HDL, LDLDIRECT, LDLCALC, ALT, AST, NA, K, CL, CREATININE, BUN, CO2, TSH, PSA, INR, GLUF, HGBA1C, MICROALBUR  --------------------------------------------------------------------------------------------------------------------- Dg Knee Complete 4 Views Right  Result Date: 07/08/2012 * PRIOR REPORT IMPORTED FROM AN EXTERNAL SYSTEM * PRIOR REPORT IMPORTED FROM THE SYNGO WORKFLOW SYSTEM REASON FOR EXAM:    right knee pain 1 week COMMENTS: PROCEDURE:     GDR - GDR KNEE RT COMP WITH OBLIQUES  - Jul 08 2012 12:51PM RESULT:     Images of the right knee demonstrate no definite fracture, dislocation or radiopaque foreign body. IMPRESSION:      Please see above. Dictation Site: 2        ---------------------------------------------------------------------------------------------------------------------- Past Medical History:  Diagnosis Date  . Allergy   . Anxiety   . Behcet's disease (HCC)   . Fibromyalgia   . Migraine   . Mitral valve prolapse   . Positive ANA (antinuclear antibody)   . RA (rheumatoid arthritis) (HCC)   . Rheumatoid arthritis (HCC)   . Tachycardia     Past Surgical History:  Procedure Laterality Date  . HERNIA REPAIR     age 24  . LAPAROSCOPIC ABDOMINAL EXPLORATION     X 2  . LAPAROSCOPY  2010   ovarian cyst and endometriosis  . MANDIBLE FRACTURE SURGERY    . WISDOM TOOTH EXTRACTION      Family History  Problem Relation Age of Onset  . Hypertension Mother   . Hyperlipidemia Mother   . Hypertension Father   . Hypertension Maternal Grandmother   . Lung disease Maternal Grandmother   . Cancer Maternal Grandfather        melanoma  . Alzheimer's disease Paternal Grandmother   . COPD Paternal Grandfather     Social History  Substance Use Topics  . Smoking status: Never Smoker  . Smokeless tobacco: Never Used  . Alcohol use Yes     Comment: on occasion     ---------------------------------------------------------------------------------------------------------------------- Social History   Social History  . Marital status: Married    Spouse name: N/A  . Number of children: N/A  . Years of education: N/A   Social History Main Topics  . Smoking status: Never Smoker  . Smokeless tobacco: Never Used  . Alcohol use Yes     Comment: on occasion  . Drug use: No  . Sexual activity: Yes    Birth  control/ protection: None   Other Topics Concern  . None   Social History Narrative  . None    Scheduled Meds: Continuous Infusions: PRN Meds:.   BP 114/75   Pulse (!) 141   Temp 98.3 F (36.8 C)   Resp 18   Ht 5\' 3"  (1.6 m)   Wt 95 lb (43.1 kg)   LMP 06/22/2016   SpO2 99%   BMI 16.83 kg/m    BP Readings from Last 3 Encounters:  07/06/16 114/75  03/05/16 125/73  02/15/16 (!) 137/94     Wt Readings from Last 3 Encounters:  07/06/16 95 lb (43.1 kg)  03/05/16 95 lb (43.1 kg)  02/15/16 100 lb (45.4 kg)     ----------------------------------------------------------------------------------------------------------------------  ROS Review of Systems  Cardiac: No angina GI: No constipation  Objective:  BP 114/75   Pulse (!) 141   Temp 98.3 F (36.8 C)   Resp 18   Ht 5\' 3"  (1.6 m)   Wt 95 lb (43.1 kg)   LMP 06/22/2016   SpO2 99%   BMI 16.83 kg/m   Physical Exam  Patient is alert oriented cooperative compliant Pupils are equally round reactive to light Heart is regular rate and rhythm without murmur Lungs are clear to also dictation without wheeze   Assessment & Plan:   Rebekah Martin was seen today for wrist pain.  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 Fibromyalgia syndrome Rebekah Martin continues to respond favorably to the Ultram and I think it's reasonable to continue this. We'll refill this medication with return to clinic in 3 months for her. In the meantime on her to continue with her stretching strengthening and core exercises as reviewed today.  2. 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 Chronic pain syndrome As above   4. Behcet's syndrome (HCC)  I   ----------------------------------------------------------------------------------------------------------------------  I have discontinued Rebekah Martin's ranitidine, propranolol, nystatin, clonazePAM, amoxicillin-clavulanate, azithromycin, chlorpheniramine-HYDROcodone, predniSONE, and methylPREDNISolone. I have also changed her traMADol. Additionally, I am having her maintain her meloxicam, Calcium Carbonate-Vit D-Min (CALCIUM 600 + MINERALS PO), docusate sodium, Multiple Vitamins-Minerals (MULTIVITAMIN ADULT PO), sulfaSALAzine, DULoxetine, SUMAtriptan, triamcinolone, Esomeprazole Magnesium (NEXIUM PO), albuterol, Adalimumab, clobetasol cream, ondansetron, and hydroxychloroquine. We will continue to administer albuterol.   Meds ordered this encounter  Medications  .  hydroxychloroquine (PLAQUENIL) 200 MG tablet    Sig: Take 200 mg by mouth daily.   Marland Kitchen DISCONTD: DULoxetine (CYMBALTA) 30 MG capsule    Sig: Take by mouth.  . DISCONTD: meloxicam (MOBIC) 15 MG tablet    Sig: Take by mouth.  . DISCONTD: sulfaSALAzine (AZULFIDINE) 500 MG EC tablet    Sig: Take 3  tabs in the AM and 2 tabs at night  . DISCONTD: hydroxychloroquine (PLAQUENIL) 200 MG tablet    Refill:  11  . DISCONTD: predniSONE (DELTASONE) 5 MG tablet    Refill:  1  . DISCONTD: methylPREDNISolone (MEDROL DOSEPAK) 4 MG TBPK tablet    Refill:  1  . traMADol (ULTRAM) 50 MG tablet    Sig: Take 1 tablet (50 mg total) by mouth every 6 (six) hours as needed.    Dispense:  90 tablet    Refill:  2       Follow-up: Return in about 3 months (around 10/06/2016) for evaluation, med refill.    [name], MD  The [facility] practitioner database for opioid medications on this patient has been reviewed by me and my staff   Greater than 50% of the total encounter time was spent in counseling and / or coordination of care.     This dictation was performed utilizing Conservation officer, historic buildings.  Please excuse any unintentional or mistaken typographical errors as a result.

## 2016-07-19 MED FILL — HUMIRA PEN 40 MG/0.8ML PNKT: 40 | 28 days supply | Qty: 2 | Fill #2

## 2016-07-20 ENCOUNTER — Other Ambulatory Visit: Payer: Self-pay | Admitting: Pharmacist

## 2016-07-20 MED ORDER — ADALIMUMAB 40 MG/0.8ML ~~LOC~~ AJKT: 0.8000 mL | AUTO-INJECTOR | SUBCUTANEOUS | 11 refills | Status: AC

## 2016-08-09 MED FILL — HUMIRA PEN 40 MG/0.8ML PNKT: 40 | 28 days supply | Qty: 2 | Fill #0

## 2016-10-08 MED FILL — HUMIRA PEN 40 MG/0.8ML PNKT: 40 | 28 days supply | Qty: 2 | Fill #1

## 2016-11-13 MED FILL — HUMIRA PEN 40 MG/0.8ML PNKT: 40 | 28 days supply | Qty: 2 | Fill #2

## 2016-11-21 ENCOUNTER — Other Ambulatory Visit: Payer: Self-pay | Admitting: Family Medicine

## 2016-11-21 MED ORDER — CLOBETASOL PROPIONATE 0.05 % EX CREA: 1.0000 "application " | TOPICAL_CREAM | Freq: Two times a day (BID) | CUTANEOUS | 0 refills | Status: AC

## 2016-11-21 NOTE — Progress Notes (Signed)
Pt needing refill on her clobetasol for ulcers popping up on b/l hands

## 2016-11-28 ENCOUNTER — Ambulatory Visit: Payer: 59 | Attending: Anesthesiology | Admitting: Anesthesiology

## 2016-11-28 ENCOUNTER — Encounter: Payer: Self-pay | Admitting: Anesthesiology

## 2016-11-28 VITALS — BP 119/81 | HR 78 | Temp 98.8°F | Resp 16 | Ht 63.0 in | Wt 108.0 lb

## 2016-11-28 DIAGNOSIS — M25531 Pain in right wrist: Secondary | ICD-10-CM | POA: Diagnosis not present

## 2016-11-28 DIAGNOSIS — M792 Neuralgia and neuritis, unspecified: Secondary | ICD-10-CM | POA: Insufficient documentation

## 2016-11-28 DIAGNOSIS — M797 Fibromyalgia: Secondary | ICD-10-CM | POA: Diagnosis not present

## 2016-11-28 DIAGNOSIS — M352 Behcet's disease: Secondary | ICD-10-CM | POA: Insufficient documentation

## 2016-11-28 DIAGNOSIS — M06032 Rheumatoid arthritis without rheumatoid factor, left wrist: Secondary | ICD-10-CM | POA: Diagnosis not present

## 2016-11-28 DIAGNOSIS — M069 Rheumatoid arthritis, unspecified: Secondary | ICD-10-CM | POA: Insufficient documentation

## 2016-11-28 DIAGNOSIS — G894 Chronic pain syndrome: Secondary | ICD-10-CM | POA: Diagnosis not present

## 2016-11-28 DIAGNOSIS — M06031 Rheumatoid arthritis without rheumatoid factor, right wrist: Secondary | ICD-10-CM

## 2016-11-28 MED ORDER — GABAPENTIN 100 MG PO CAPS: 100.0000 mg | ORAL_CAPSULE | Freq: Three times a day (TID) | ORAL | 1 refills | Status: DC

## 2016-11-28 MED ORDER — TRAMADOL HCL 50 MG PO TABS: 50.0000 mg | ORAL_TABLET | Freq: Four times a day (QID) | ORAL | 2 refills | Status: DC | PRN

## 2016-11-28 NOTE — Progress Notes (Signed)
Subjective:  Patient ID: Rebekah Martin, female    DOB: [birth year]  Age: 41 y.o. MRN: [id number]  CC: Wrist Pain (right) and Knee Pain (bilateral)   Procedure: None   HPI Rebekah Martin presents for reevaluation. She was last seen several months ago. She continues to have diffuse body pain secondary to her arthritis and fibromyalgia. She continues to complain of low back pain but no significant radiation of numbness or tingling or sciatica-like symptoms to the lower extremities at this time. Unfortunately the pain has remained quite recalcitrant despite exercise management and conservative therapy. Physical therapy modalities do not seem to help much. No change in bowel or bladder function or lower extremity strength or function are noted. The quality characteristic and distribution of her pain have remained stable as well. She has been taking Ultram for pain relief 3 times a day and this jelly works well. She does frequently have restless leg like symptoms and some occasional burning-like pain radiating into the lower extremities most notable at night. Strength is been stable with no evidence of weakness noted.  Outpatient Medications Prior to Visit  Medication Sig Dispense Refill  . Adalimumab (HUMIRA PEN) 40 MG/0.8ML PNKT Inject 0.8 mLs into the skin every 14 (fourteen) days. 2 each 11  . albuterol (PROVENTIL HFA;VENTOLIN HFA) 108 (90 Base) MCG/ACT inhaler Inhale 2 puffs into the lungs every 6 (six) hours as needed for wheezing or shortness of breath. 1 Inhaler 0  . Calcium Carbonate-Vit D-Min (CALCIUM 600 + MINERALS PO) Take 600 mg by mouth 2 (two) times daily.    . clobetasol cream (TEMOVATE) 0.05 % Apply 1 application topically 2 (two) times daily. 60 g 0  . docusate sodium (STOOL SOFTENER) 100 MG capsule Take 100 mg by mouth 2 (two) times daily.    . DULoxetine (CYMBALTA) 30 MG capsule Take by mouth.    . hydroxychloroquine (PLAQUENIL) 200 MG tablet Take 200 mg by mouth daily.     .  meloxicam (MOBIC) 15 MG tablet Take 15 mg by mouth daily as needed for pain.    . Multiple Vitamins-Minerals (MULTIVITAMIN ADULT PO) Take by mouth daily.    . ondansetron (ZOFRAN) 4 MG tablet Take 1 tablet (4 mg total) by mouth every 8 (eight) hours as needed for nausea or vomiting. 30 tablet 0  . sulfaSALAzine (AZULFIDINE) 500 MG EC tablet Take 1,500 mg by mouth daily.   3  . SUMAtriptan (IMITREX) 25 MG tablet Take by mouth.    . triamcinolone (KENALOG) 0.1 % paste Apply to affected area(s) 2 times per day as needed    . traMADol (ULTRAM) 50 MG tablet Take 1 tablet (50 mg total) by mouth every 6 (six) hours as needed. 90 tablet 2  . Esomeprazole Magnesium (NEXIUM PO) Take 22.3 mg by mouth daily.     Facility-Administered Medications Prior to Visit  Medication Dose Route Frequency Provider Last Rate Last Dose  . albuterol (PROVENTIL) (2.5 MG/3ML) 0.083% nebulizer solution 2.5 mg  2.5 mg Nebulization Once Particia Nearing, PA-C        Review of Systems CNS: No sedation or confusion Cardiac: No angina or palpitations GI: No constipation or abdominal pain  Objective:  BP 119/81   Pulse 78   Temp 98.8 F (37.1 C)   Resp 16   Ht 5\' 3"  (1.6 m)   Wt 108 lb (49 kg)   LMP 10/29/2016   SpO2 100%   BMI 19.13 kg/m    BP Readings  from Last 3 Encounters:  11/28/16 119/81  07/06/16 114/75  03/05/16 125/73     Wt Readings from Last 3 Encounters:  11/28/16 108 lb (49 kg)  07/06/16 95 lb (43.1 kg)  03/05/16 95 lb (43.1 kg)     Physical Exam Pt is alert and oriented PERRL EOMI HEART IS RRR no murmur or rub LCTA no wheezing or rhales MUSCULOSKELETALReveals some mild paraspinous muscle tenderness in the thoracic and lumbar region. She has good range of motion at the lower back and throughout the lower extremities. Her muscle tone and bulk is at baseline. Strength is at baseline.  Labs  No results found for: HGBA1C No results found for: GLUF, MICROALBUR, LDLCALC,  CREATININE  -------------------------------------------------------------------------------------------------------------------- No results found for: WBC, HGB, HCT, PLT, GLUCOSE, CHOL, TRIG, HDL, LDLDIRECT, LDLCALC, ALT, AST, NA, K, CL, CREATININE, BUN, CO2, TSH, PSA, INR, GLUF, HGBA1C, MICROALBUR  --------------------------------------------------------------------------------------------------------------------- Dg Chest 2 View  Result Date: 02/15/2016 CLINICAL DATA:  Cough for 10 days EXAM: CHEST  2 VIEW COMPARISON:  None. FINDINGS: Cardiomediastinal silhouette is unremarkable. No infiltrate or pleural effusion. No pulmonary edema. Mild hyperinflation. Bony thorax unremarkable. IMPRESSION: No active cardiopulmonary disease.  Mild hyperinflation Electronically Signed   By: Natasha Mead M.D.   On: 02/15/2016 13:37     Assessment & Plan:   Rebekah Martin was seen today for wrist pain and knee pain.  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 -     gabapentin (NEURONTIN) 100 MG capsule; Take 1 capsule (100 mg total) by mouth 3 (three) times daily.        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 Fibromyalgia syndrome I'm going to refill her Ultram as this seems to be working well for her based on her narcotic assessment sheet. I'm also going to add in Neurontin for some of the neuralgia-like pain that she is experiencing particularly at night. I want her to start at 100 mg daily at bedtime for the first 3 nights then increase to 2 tablets 4 a total of 200 mg daily at bedtime for the next 3 nights and ultimately to 300 mg at nighttime. She is to return to clinic in 1 month for reevaluation.  2. 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 Chronic pain syndrome   4. Behcet's syndrome (HCC)   5. Neuropathic pain As above. We'll have her return to clinic for reevaluation in one month.    ----------------------------------------------------------------------------------------------------------------------  I am having Rebekah Martin start on gabapentin. I am also having her maintain her meloxicam, Calcium Carbonate-Vit D-Min (CALCIUM  600 + MINERALS PO), docusate sodium, Multiple Vitamins-Minerals (MULTIVITAMIN ADULT PO), sulfaSALAzine, DULoxetine, SUMAtriptan, triamcinolone, Esomeprazole Magnesium (NEXIUM PO), albuterol, ondansetron, hydroxychloroquine, Adalimumab, clobetasol cream, Clobetasol Prop Emollient Base, Adalimumab, albuterol, albuterol, clobetasol cream, docusate sodium, DULoxetine, Esomeprazole Magnesium, meloxicam, ondansetron, sulfaSALAzine, and traMADol. We will continue to administer albuterol.   Meds ordered this encounter  Medications  . Clobetasol Prop Emollient Base (CLOBETASOL PROPIONATE E) 0.05 % emollient cream    Sig: Apply topically.  . Adalimumab 40 MG/0.8ML PNKT    Sig: Inject into the skin.  Marland Kitchen albuterol (PROVENTIL) (2.5 MG/3ML)  0.083% nebulizer solution    Sig: Inhale into the lungs.  Marland Kitchen albuterol (PROVENTIL HFA;VENTOLIN HFA) 108 (90 Base) MCG/ACT inhaler    Sig: Inhale into the lungs.  . clobetasol cream (TEMOVATE) 0.05 %    Sig: as needed.  . docusate sodium (COLACE) 100 MG capsule  . DULoxetine (CYMBALTA) 30 MG capsule    Sig: Take by mouth.  . Esomeprazole Magnesium 20 MG TBEC    Sig: Take by mouth.  . meloxicam (MOBIC) 15 MG tablet    Sig: Take by mouth.  . ondansetron (ZOFRAN) 4 MG tablet    Sig: 4 mg as needed.   . sulfaSALAzine (AZULFIDINE) 500 MG EC tablet    Sig: TAKE 3 TABLETS BY MOUTH IN THE AM AND 2 TABLETS BY MOUTH AT NIGHT  . traMADol (ULTRAM) 50 MG tablet    Sig: Take 1 tablet (50 mg total) by mouth every 6 (six) hours as needed.    Dispense:  90 tablet    Refill:  2  . gabapentin (NEURONTIN) 100 MG capsule    Sig: Take 1 capsule (100 mg total) by mouth 3 (three) times daily.    Dispense:  90 capsule    Refill:  1   Patient's Medications  New Prescriptions   GABAPENTIN (NEURONTIN) 100 MG CAPSULE    Take 1 capsule (100 mg total) by mouth 3 (three) times daily.  Previous Medications   ADALIMUMAB (HUMIRA PEN) 40 MG/0.8ML PNKT    Inject 0.8 mLs into the skin every 14 (fourteen) days.   ADALIMUMAB 40 MG/0.8ML PNKT    Inject into the skin.   ALBUTEROL (PROVENTIL HFA;VENTOLIN HFA) 108 (90 BASE) MCG/ACT INHALER    Inhale 2 puffs into the lungs every 6 (six) hours as needed for wheezing or shortness of breath.   ALBUTEROL (PROVENTIL HFA;VENTOLIN HFA) 108 (90 BASE) MCG/ACT INHALER    Inhale into the lungs.   ALBUTEROL (PROVENTIL) (2.5 MG/3ML) 0.083% NEBULIZER SOLUTION    Inhale into the lungs.   CALCIUM CARBONATE-VIT D-MIN (CALCIUM 600 + MINERALS PO)    Take 600 mg by mouth 2 (two) times daily.   CLOBETASOL CREAM (TEMOVATE) 0.05 %    Apply 1 application topically 2 (two) times daily.   CLOBETASOL CREAM (TEMOVATE) 0.05 %    as needed.   CLOBETASOL PROP EMOLLIENT BASE (CLOBETASOL PROPIONATE E) 0.05  % EMOLLIENT CREAM    Apply topically.   DOCUSATE SODIUM (COLACE) 100 MG CAPSULE       DOCUSATE SODIUM (STOOL SOFTENER) 100 MG CAPSULE    Take 100 mg by mouth 2 (two) times daily.   DULOXETINE (CYMBALTA) 30 MG CAPSULE    Take by mouth.   DULOXETINE (CYMBALTA) 30 MG CAPSULE    Take by mouth.   ESOMEPRAZOLE MAGNESIUM (NEXIUM PO)    Take 22.3 mg by mouth daily.   ESOMEPRAZOLE MAGNESIUM 20 MG TBEC    Take by mouth.  HYDROXYCHLOROQUINE (PLAQUENIL) 200 MG TABLET    Take 200 mg by mouth daily.    MELOXICAM (MOBIC) 15 MG TABLET    Take 15 mg by mouth daily as needed for pain.   MELOXICAM (MOBIC) 15 MG TABLET    Take by mouth.   MULTIPLE VITAMINS-MINERALS (MULTIVITAMIN ADULT PO)    Take by mouth daily.   ONDANSETRON (ZOFRAN) 4 MG TABLET    Take 1 tablet (4 mg total) by mouth every 8 (eight) hours as needed for nausea or vomiting.   ONDANSETRON (ZOFRAN) 4 MG TABLET    4 mg as needed.    SULFASALAZINE (AZULFIDINE) 500 MG EC TABLET    Take 1,500 mg by mouth daily.    SULFASALAZINE (AZULFIDINE) 500 MG EC TABLET    TAKE 3 TABLETS BY MOUTH IN THE AM AND 2 TABLETS BY MOUTH AT NIGHT   SUMATRIPTAN (IMITREX) 25 MG TABLET    Take by mouth.   TRIAMCINOLONE (KENALOG) 0.1 % PASTE    Apply to affected area(s) 2 times per day as needed  Modified Medications   Modified Medication Previous Medication   TRAMADOL (ULTRAM) 50 MG TABLET traMADol (ULTRAM) 50 MG tablet      Take 1 tablet (50 mg total) by mouth every 6 (six) hours as needed.    Take 1 tablet (50 mg total) by mouth every 6 (six) hours as needed.  Discontinued Medications   No medications on file   ----------------------------------------------------------------------------------------------------------------------  Follow-up: Return in about 1 month (around 12/29/2016) for evaluation, med refill.    [name], MD

## 2016-11-28 NOTE — Progress Notes (Signed)
Nursing Pain Medication Assessment:  Safety precautions to be maintained throughout the outpatient stay will include: orient to surroundings, keep bed in low position, maintain call bell within reach at all times, provide assistance with transfer out of bed and ambulation.  Medication Inspection Compliance: Pill count conducted under aseptic conditions, in front of the patient. Neither the pills nor the bottle was removed from the patient's sight at any time. Once count was completed pills were immediately returned to the patient in their original bottle.  Medication: Tramadol (Ultram) Pill/Patch Count: 2 of 90 pills remain Pill/Patch Appearance: Markings consistent with prescribed medication Bottle Appearance: Standard pharmacy container. Clearly labeled. Filled Date: 08 / 13 / 2018 Last Medication intake:  Yesterday

## 2016-12-13 MED FILL — HUMIRA PEN 40 MG/0.8ML PNKT: 40 | 28 days supply | Qty: 2 | Fill #3

## 2017-01-01 ENCOUNTER — Encounter: Payer: Self-pay | Admitting: Anesthesiology

## 2017-01-01 ENCOUNTER — Ambulatory Visit: Payer: 59 | Attending: Anesthesiology | Admitting: Anesthesiology

## 2017-01-01 VITALS — BP 116/75 | HR 136 | Temp 98.4°F | Resp 16 | Ht 63.0 in | Wt 106.0 lb

## 2017-01-01 DIAGNOSIS — G2581 Restless legs syndrome: Secondary | ICD-10-CM | POA: Insufficient documentation

## 2017-01-01 DIAGNOSIS — M352 Behcet's disease: Secondary | ICD-10-CM | POA: Insufficient documentation

## 2017-01-01 DIAGNOSIS — M06032 Rheumatoid arthritis without rheumatoid factor, left wrist: Secondary | ICD-10-CM | POA: Insufficient documentation

## 2017-01-01 DIAGNOSIS — Z79899 Other long term (current) drug therapy: Secondary | ICD-10-CM | POA: Diagnosis not present

## 2017-01-01 DIAGNOSIS — M792 Neuralgia and neuritis, unspecified: Secondary | ICD-10-CM | POA: Insufficient documentation

## 2017-01-01 DIAGNOSIS — M797 Fibromyalgia: Secondary | ICD-10-CM | POA: Insufficient documentation

## 2017-01-01 DIAGNOSIS — M06031 Rheumatoid arthritis without rheumatoid factor, right wrist: Secondary | ICD-10-CM | POA: Diagnosis not present

## 2017-01-01 DIAGNOSIS — G894 Chronic pain syndrome: Secondary | ICD-10-CM | POA: Insufficient documentation

## 2017-01-01 MED ORDER — GABAPENTIN 100 MG PO CAPS: 100.0000 mg | ORAL_CAPSULE | Freq: Three times a day (TID) | ORAL | 2 refills | Status: DC

## 2017-01-01 NOTE — Progress Notes (Signed)
Safety precautions to be maintained throughout the outpatient stay will include: orient to surroundings, keep bed in low position, maintain call bell within reach at all times, provide assistance with transfer out of bed and ambulation.  

## 2017-01-01 NOTE — Progress Notes (Signed)
Subjective:  Patient ID: Rebekah ShowersAmy Rebekah Martin, female    DOB: 06/19/1975  Age: 41 y.o. MRN: [id number]  CC: Joint Pain (arthritic pain) and Generalized Body Aches (fibromyalgia)   Procedure: None  HPI Rebekah Charlyne PetrinJ Martin presents for reevaluation.  She was last seen approximately 3 months ago and has been doing well with her diffuse body pain and fibromyalgia.  She recently started Neurontin 100 mg in the morning and 200 mg at nighttime.  This seems to be working well for her.  She states that she is having less pain in her back and down her legs at night and less restless leg syndrome.  She is also sleeping better and feels that she has made significant improvements between that and the Ultram.  No untoward side effects are noted.  Otherwise she is in her usual state of health without change in upper or lower extremity strength or functional bowel bladder function.  Outpatient Medications Prior to Visit  Medication Sig Dispense Refill  . Adalimumab (HUMIRA PEN) 40 MG/0.8ML PNKT Inject 0.8 mLs into the skin every 14 (fourteen) days. 2 each 11  . albuterol (PROVENTIL HFA;VENTOLIN HFA) 108 (90 Base) MCG/ACT inhaler Inhale 2 puffs into the lungs every 6 (six) hours as needed for wheezing or shortness of breath. 1 Inhaler 0  . Calcium Carbonate-Vit D-Min (CALCIUM 600 + MINERALS PO) Take 600 mg by mouth 2 (two) times daily.    . clobetasol cream (TEMOVATE) 0.05 % Apply 1 application topically 2 (two) times daily. 60 g 0  . Clobetasol Prop Emollient Base (CLOBETASOL PROPIONATE E) 0.05 % emollient cream Apply topically.    . docusate sodium (STOOL SOFTENER) 100 MG capsule Take 100 mg by mouth 2 (two) times daily.    . DULoxetine (CYMBALTA) 30 MG capsule Take by mouth.    . Esomeprazole Magnesium (NEXIUM PO) Take 22.3 mg by mouth daily.    . hydroxychloroquine (PLAQUENIL) 200 MG tablet Take 200 mg by mouth daily.     . meloxicam (MOBIC) 15 MG tablet Take 15 mg by mouth daily as needed for pain.    . Multiple  Vitamins-Minerals (MULTIVITAMIN ADULT PO) Take by mouth daily.    . ondansetron (ZOFRAN) 4 MG tablet Take 1 tablet (4 mg total) by mouth every 8 (eight) hours as needed for nausea or vomiting. 30 tablet 0  . sulfaSALAzine (AZULFIDINE) 500 MG EC tablet Take 1,500 mg by mouth daily.   3  . SUMAtriptan (IMITREX) 25 MG tablet Take as needed by mouth.     . traMADol (ULTRAM) 50 MG tablet Take 1 tablet (50 mg total) by mouth every 6 (six) hours as needed. 90 tablet 2  . triamcinolone (KENALOG) 0.1 % paste Apply to affected area(s) 2 times per day as needed    . gabapentin (NEURONTIN) 100 MG capsule Take 1 capsule (100 mg total) by mouth 3 (three) times daily. 90 capsule 1  . Adalimumab 40 MG/0.8ML PNKT Inject into the skin.    Marland Kitchen. albuterol (PROVENTIL HFA;VENTOLIN HFA) 108 (90 Base) MCG/ACT inhaler Inhale into the lungs.    Marland Kitchen. albuterol (PROVENTIL) (2.5 MG/3ML) 0.083% nebulizer solution Inhale into the lungs.    . clobetasol cream (TEMOVATE) 0.05 % as needed.    . docusate sodium (COLACE) 100 MG capsule     . DULoxetine (CYMBALTA) 30 MG capsule Take by mouth.    . Esomeprazole Magnesium 20 MG TBEC Take by mouth.    . meloxicam (MOBIC) 15 MG tablet Take by mouth.    .Marland Kitchen  ondansetron (ZOFRAN) 4 MG tablet 4 mg as needed.     . sulfaSALAzine (AZULFIDINE) 500 MG EC tablet TAKE 3 TABLETS BY MOUTH IN THE AM AND 2 TABLETS BY MOUTH AT NIGHT     Facility-Administered Medications Prior to Visit  Medication Dose Route Frequency Provider Last Rate Last Dose  . albuterol (PROVENTIL) (2.5 MG/3ML) 0.083% nebulizer solution 2.5 mg  2.5 mg Nebulization Once Particia Nearing, PA-C        Review of Systems CNS: No sedation or confusion Cardiac: No angina or palpitations GI: No constipation or abdominal pain  Objective:  BP 116/75 (BP Location: Right Arm, Patient Position: Sitting, Cuff Size: Normal)   Pulse (!) 136   Temp 98.4 F (36.9 C) (Oral)   Resp 16   Ht 5\' 3"  (1.6 m)   Wt 106 lb (48.1 kg)   LMP  12/12/2016   SpO2 100%   BMI 18.78 kg/m    BP Readings from Last 3 Encounters:  01/01/17 116/75  11/28/16 119/81  07/06/16 114/75     Wt Readings from Last 3 Encounters:  01/01/17 106 lb (48.1 kg)  11/28/16 108 lb (49 kg)  07/06/16 95 lb (43.1 kg)     Physical Exam Pt is alert and oriented PERRL EOMI HEART IS RRR no murmur or rub LCTA no wheezing or rhales MUSCULOSKELETAL reveals good strength throughout the lower and upper extremities and she appears to be ambulating well without an antalgic gait.  Labs  No results found for: HGBA1C No results found for: GLUF, MICROALBUR, LDLCALC, CREATININE  -------------------------------------------------------------------------------------------------------------------- No results found for: WBC, HGB, HCT, PLT, GLUCOSE, CHOL, TRIG, HDL, LDLDIRECT, LDLCALC, ALT, AST, NA, K, CL, CREATININE, BUN, CO2, TSH, PSA, INR, GLUF, HGBA1C, MICROALBUR  --------------------------------------------------------------------------------------------------------------------- Dg Chest 2 View  Result Date: 02/15/2016 CLINICAL DATA:  Cough for 10 days EXAM: CHEST  2 VIEW COMPARISON:  None. FINDINGS: Cardiomediastinal silhouette is unremarkable. No infiltrate or pleural effusion. No pulmonary edema. Mild hyperinflation. Bony thorax unremarkable. IMPRESSION: No active cardiopulmonary disease.  Mild hyperinflation Electronically Signed   By: 02/17/2016 M.D.   On: 02/15/2016 13:37     Assessment & Plan:   Rebekah Martin was seen today for joint pain and generalized body aches.  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 Fibromyalgia syndrome We will refill her medications for the next 2 months with return to clinic in approximately 3 months.  Continue her Neurontin and Ultram at her current dosing.  Continue efforts at stretching strengthening as discussed today.  2. 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 Chronic pain syndrome   4. Behcet's syndrome (HCC)   5.  Neuropathic pain She is to return to clinic in 3 months    ----------------------------------------------------------------------------------------------------------------------  I have changed Rebekah Martin's gabapentin. I am also having her maintain her meloxicam, Calcium Carbonate-Vit D-Min (CALCIUM 600 + MINERALS PO), docusate sodium, Multiple Vitamins-Minerals (MULTIVITAMIN ADULT PO), sulfaSALAzine, DULoxetine, SUMAtriptan, triamcinolone, Esomeprazole Magnesium (NEXIUM PO), albuterol, ondansetron, hydroxychloroquine, Adalimumab, clobetasol cream, Clobetasol Prop Emollient Base, Adalimumab, albuterol, albuterol, clobetasol cream, docusate sodium, DULoxetine, Esomeprazole Magnesium, meloxicam, ondansetron, sulfaSALAzine, and traMADol. We will continue to administer albuterol.   Meds ordered this encounter  Medications  . gabapentin (NEURONTIN) 100 MG capsule    Sig: Take 1 capsule (100  mg total) 3 (three) times daily by mouth.    Dispense:  90 capsule    Refill:  2     Medication List        Accurate as of 01/01/17 12:56 PM. Always use your most recent med list.          * Adalimumab 40 MG/0.8ML Pnkt   * Adalimumab 40 MG/0.8ML Pnkt Commonly known as:  HUMIRA PEN Inject 0.8 mLs into the skin every 14 (fourteen) days.   * albuterol 108 (90 Base) MCG/ACT inhaler Commonly known as:  PROVENTIL HFA;VENTOLIN HFA Inhale 2 puffs into the lungs every 6 (six) hours as needed for wheezing or shortness of breath.   * albuterol (2.5 MG/3ML) 0.083% nebulizer solution Commonly known as:  PROVENTIL   * albuterol 108 (90 Base) MCG/ACT inhaler Commonly known as:  PROVENTIL HFA;VENTOLIN HFA   CALCIUM 600 + MINERALS PO   * clobetasol cream 0.05 % Commonly known as:  TEMOVATE   * clobetasol cream 0.05 % Commonly known as:  TEMOVATE Apply 1 application topically 2 (two) times daily.   CLOBETASOL PROPIONATE E 0.05 % emollient cream Generic drug:  Clobetasol Prop Emollient Base   * DULoxetine 30 MG capsule Commonly known as:  CYMBALTA   * DULoxetine 30 MG capsule Commonly known as:  CYMBALTA   gabapentin 100 MG capsule Commonly known as:  NEURONTIN Take 1 capsule (100 mg total) 3 (three) times daily by mouth.   hydroxychloroquine 200 MG tablet Commonly known as:  PLAQUENIL   * meloxicam 15 MG tablet Commonly known as:  MOBIC   * meloxicam 15 MG tablet Commonly known as:  MOBIC   MULTIVITAMIN ADULT PO   * NEXIUM PO   * Esomeprazole Magnesium 20 MG Tbec   * ondansetron 4 MG tablet Commonly known as:  ZOFRAN   * ondansetron 4 MG tablet Commonly known as:  ZOFRAN Take 1 tablet (4 mg total) by mouth every 8 (eight) hours as needed for nausea or vomiting.   * STOOL SOFTENER 100 MG capsule Generic drug:  docusate sodium   * docusate sodium 100 MG capsule Commonly known as:  COLACE   * sulfaSALAzine 500 MG EC tablet Commonly known as:  AZULFIDINE   *  sulfaSALAzine 500 MG EC tablet Commonly known as:  AZULFIDINE   SUMAtriptan 25 MG tablet Commonly known as:  IMITREX   traMADol 50 MG tablet Commonly known as:  ULTRAM Take 1 tablet (50 mg total) by mouth every 6 (six) hours as needed.   triamcinolone 0.1 % paste Commonly known as:  KENALOG      * This list has 19 medication(s) that are the same as other medications prescribed for you. Read the directions carefully, and ask your doctor or other care provider to review them with you.  Where to Get Your Medications    These medications were sent to [facility] - [place], [place] - [address]  [address], [place] [address]   Phone:  [phone]   gabapentin 100 MG capsule    ----------------------------------------------------------------------------------------------------------------------  Follow-up: Return in about 3 months (around 04/03/2017) for evaluation, med refill.    [name], MD

## 2017-01-18 ENCOUNTER — Other Ambulatory Visit: Payer: Self-pay | Admitting: Family Medicine

## 2017-01-18 MED ORDER — ONDANSETRON HCL 4 MG PO TABS: 4.0000 mg | ORAL_TABLET | Freq: Three times a day (TID) | ORAL | 0 refills | Status: DC | PRN

## 2017-01-18 MED FILL — HUMIRA PEN 40 MG/0.8ML PNKT: 40 | 28 days supply | Qty: 2 | Fill #4

## 2017-01-18 NOTE — Telephone Encounter (Signed)
Routing to provider  

## 2017-01-21 NOTE — Telephone Encounter (Signed)
Zofran refill sent Friday afternoon

## 2017-02-14 DIAGNOSIS — M352 Behcet's disease: Secondary | ICD-10-CM | POA: Diagnosis not present

## 2017-02-14 DIAGNOSIS — M797 Fibromyalgia: Secondary | ICD-10-CM | POA: Diagnosis not present

## 2017-02-14 DIAGNOSIS — M0609 Rheumatoid arthritis without rheumatoid factor, multiple sites: Secondary | ICD-10-CM | POA: Diagnosis not present

## 2017-02-14 DIAGNOSIS — Z79899 Other long term (current) drug therapy: Secondary | ICD-10-CM | POA: Diagnosis not present

## 2017-02-15 MED FILL — HUMIRA PEN 40 MG/0.8ML PNKT: 40 | 28 days supply | Qty: 2 | Fill #5

## 2017-03-28 ENCOUNTER — Encounter: Payer: Self-pay | Admitting: Anesthesiology

## 2017-03-28 ENCOUNTER — Ambulatory Visit: Payer: Managed Care, Other (non HMO) | Attending: Anesthesiology | Admitting: Anesthesiology

## 2017-03-28 ENCOUNTER — Other Ambulatory Visit: Payer: Self-pay

## 2017-03-28 VITALS — BP 130/85 | HR 151 | Temp 98.2°F | Resp 18 | Ht 63.0 in | Wt 105.0 lb

## 2017-03-28 DIAGNOSIS — G894 Chronic pain syndrome: Secondary | ICD-10-CM | POA: Diagnosis not present

## 2017-03-28 DIAGNOSIS — M06032 Rheumatoid arthritis without rheumatoid factor, left wrist: Secondary | ICD-10-CM

## 2017-03-28 DIAGNOSIS — M06031 Rheumatoid arthritis without rheumatoid factor, right wrist: Secondary | ICD-10-CM

## 2017-03-28 DIAGNOSIS — M069 Rheumatoid arthritis, unspecified: Secondary | ICD-10-CM | POA: Diagnosis not present

## 2017-03-28 DIAGNOSIS — Z79899 Other long term (current) drug therapy: Secondary | ICD-10-CM | POA: Insufficient documentation

## 2017-03-28 DIAGNOSIS — M352 Behcet's disease: Secondary | ICD-10-CM | POA: Diagnosis not present

## 2017-03-28 DIAGNOSIS — M25561 Pain in right knee: Secondary | ICD-10-CM | POA: Diagnosis present

## 2017-03-28 DIAGNOSIS — M797 Fibromyalgia: Secondary | ICD-10-CM | POA: Insufficient documentation

## 2017-03-28 DIAGNOSIS — M792 Neuralgia and neuritis, unspecified: Secondary | ICD-10-CM

## 2017-03-28 MED ORDER — GABAPENTIN 100 MG PO CAPS: 100.0000 mg | ORAL_CAPSULE | Freq: Three times a day (TID) | ORAL | 2 refills | Status: DC

## 2017-03-28 MED ORDER — TRAMADOL HCL 50 MG PO TABS: 50.0000 mg | ORAL_TABLET | Freq: Four times a day (QID) | ORAL | 2 refills | Status: DC | PRN

## 2017-03-28 NOTE — Progress Notes (Signed)
Subjective:  Patient ID: Rebekah Martin, female    DOB: 03/25/1975  Age: 42 y.o. MRN: [id number]  CC: Knee Pain (right)   Procedure: None  HPI Rebekah Martin presents for reevaluation.  She was last seen 3 months ago and has been doing well with her current regimen.  She takes Ultram and Neurontin for her chronic diffuse pain.  The quality characteristic and distribution of this have been stable in nature with no reported changes recently.  Based on her narcotic assessment sheet she continues to derive good functional improvement with these medications with minimal side effects.  No change in lower extremity strength weakness or bowel or bladder function is noted.  Outpatient Medications Prior to Visit  Medication Sig Dispense Refill  . Adalimumab (HUMIRA PEN) 40 MG/0.8ML PNKT Inject 0.8 mLs into the skin every 14 (fourteen) days. 2 each 11  . Adalimumab 40 MG/0.8ML PNKT Inject into the skin.    Marland Kitchen albuterol (PROVENTIL HFA;VENTOLIN HFA) 108 (90 Base) MCG/ACT inhaler Inhale 2 puffs into the lungs every 6 (six) hours as needed for wheezing or shortness of breath. 1 Inhaler 0  . Calcium Carbonate-Vit D-Min (CALCIUM 600 + MINERALS PO) Take 600 mg by mouth 2 (two) times daily.    . clobetasol cream (TEMOVATE) 0.05 % Apply 1 application topically 2 (two) times daily. 60 g 0  . Clobetasol Prop Emollient Base (CLOBETASOL PROPIONATE E) 0.05 % emollient cream Apply topically.    . docusate sodium (STOOL SOFTENER) 100 MG capsule Take 100 mg by mouth 2 (two) times daily.    . DULoxetine (CYMBALTA) 30 MG capsule Take by mouth.    . Esomeprazole Magnesium 20 MG TBEC Take by mouth.    . hydroxychloroquine (PLAQUENIL) 200 MG tablet Take 200 mg by mouth daily.     . meloxicam (MOBIC) 15 MG tablet Take 15 mg by mouth daily as needed for pain.    . Multiple Vitamins-Minerals (MULTIVITAMIN ADULT PO) Take by mouth daily.    . ondansetron (ZOFRAN) 4 MG tablet Take 1 tablet (4 mg total) by mouth every 8 (eight)  hours as needed for nausea or vomiting. 30 tablet 0  . sulfaSALAzine (AZULFIDINE) 500 MG EC tablet TAKE 3 TABLETS BY MOUTH IN THE AM AND 2 TABLETS BY MOUTH AT NIGHT    . SUMAtriptan (IMITREX) 25 MG tablet Take as needed by mouth.     . triamcinolone (KENALOG) 0.1 % paste Apply to affected area(s) 2 times per day as needed    . gabapentin (NEURONTIN) 100 MG capsule Take 1 capsule (100 mg total) 3 (three) times daily by mouth. 90 capsule 2  . traMADol (ULTRAM) 50 MG tablet Take 1 tablet (50 mg total) by mouth every 6 (six) hours as needed. 90 tablet 2  . albuterol (PROVENTIL HFA;VENTOLIN HFA) 108 (90 Base) MCG/ACT inhaler Inhale into the lungs.    Marland Kitchen albuterol (PROVENTIL) (2.5 MG/3ML) 0.083% nebulizer solution Inhale into the lungs.    . clobetasol cream (TEMOVATE) 0.05 % as needed.    . docusate sodium (COLACE) 100 MG capsule     . DULoxetine (CYMBALTA) 30 MG capsule Take by mouth.    . Esomeprazole Magnesium (NEXIUM PO) Take 22.3 mg by mouth daily.    . meloxicam (MOBIC) 15 MG tablet Take by mouth.    . ondansetron (ZOFRAN) 4 MG tablet 4 mg as needed.     . sulfaSALAzine (AZULFIDINE) 500 MG EC tablet Take 1,500 mg by mouth daily.   3  Facility-Administered Medications Prior to Visit  Medication Dose Route Frequency Provider Last Rate Last Dose  . albuterol (PROVENTIL) (2.5 MG/3ML) 0.083% nebulizer solution 2.5 mg  2.5 mg Nebulization Once Particia Nearing, PA-C        Review of Systems CNS: No confusion or sedation Cardiac: No angina or palpitations GI: No abdominal pain or constipation   Objective:  BP 130/85   Pulse (!) 151   Temp 98.2 F (36.8 C) (Oral)   Resp 18   Ht 5\' 3"  (1.6 m)   Wt 105 lb (47.6 kg)   SpO2 97%   BMI 18.60 kg/m    BP Readings from Last 3 Encounters:  03/28/17 130/85  01/01/17 116/75  11/28/16 119/81     Wt Readings from Last 3 Encounters:  03/28/17 105 lb (47.6 kg)  01/01/17 106 lb (48.1 kg)  11/28/16 108 lb (49 kg)     Physical  Exam Pt is alert and oriented PERRL EOMI HEART IS RRR no murmur or rub LCTA no wheezing or rales MUSCULOSKELETAL reveals some mild right knee pain but no swelling appreciated  Labs  No results found for: HGBA1C No results found for: GLUF, MICROALBUR, LDLCALC, CREATININE  -------------------------------------------------------------------------------------------------------------------- No results found for: WBC, HGB, HCT, PLT, GLUCOSE, CHOL, TRIG, HDL, LDLDIRECT, LDLCALC, ALT, AST, NA, K, CL, CREATININE, BUN, CO2, TSH, PSA, INR, GLUF, HGBA1C, MICROALBUR  --------------------------------------------------------------------------------------------------------------------- Dg Chest 2 View  Result Date: 02/15/2016 CLINICAL DATA:  Cough for 10 days EXAM: CHEST  2 VIEW COMPARISON:  None. FINDINGS: Cardiomediastinal silhouette is unremarkable. No infiltrate or pleural effusion. No pulmonary edema. Mild hyperinflation. Bony thorax unremarkable. IMPRESSION: No active cardiopulmonary disease.  Mild hyperinflation Electronically Signed   By: Natasha Mead M.D.   On: 02/15/2016 13:37     Assessment & Plan:   Rebekah Martin was seen today for knee pain.  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 -     gabapentin (NEURONTIN) 100 MG capsule; Take 1 capsule (100 mg total) by mouth 3 (three) times daily.        ----------------------------------------------------------------------------------------------------------------------  Problem List Items Addressed This Visit      Unprioritized   Behcet's syndrome (HCC)    Other Visit Diagnoses    Fibromyalgia syndrome    -  Primary   Rheumatoid arthritis involving  both wrists with negative rheumatoid factor (HCC)       Relevant Medications   traMADol (ULTRAM) 50 MG tablet   Chronic pain syndrome       Neuropathic pain            ----------------------------------------------------------------------------------------------------------------------  1. Fibromyalgia syndrome   2. 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  Refills were given today for both the Ultram and Neurontin.  She will be to return to clinic in 3 months.  If any changes are to occur she is to contact us and continue follow-up with her primary care physicians.  3. Chronic pain syndrome  The [facility] practitioner database information has been reviewed and is appropriate. 4. Behcet's syndrome (HCC)   5. Neuropathic pain     ----------------------------------------------------------------------------------------------------------------------  I have changed [facility] J. Mudgett's gabapentin. I  am also having her maintain her meloxicam, Calcium Carbonate-Vit D-Min (CALCIUM 600 + MINERALS PO), docusate sodium, Multiple Vitamins-Minerals (MULTIVITAMIN ADULT PO), sulfaSALAzine, DULoxetine, SUMAtriptan, triamcinolone, Esomeprazole Magnesium (NEXIUM PO), albuterol, hydroxychloroquine, Adalimumab, clobetasol cream, Clobetasol Prop Emollient Base, Adalimumab, albuterol, albuterol, clobetasol cream, docusate sodium, DULoxetine, Esomeprazole Magnesium, meloxicam, ondansetron, sulfaSALAzine, ondansetron, and traMADol. We will continue to administer albuterol.   Meds ordered this encounter  Medications  . traMADol (ULTRAM) 50 MG tablet    Sig: Take 1 tablet (50 mg total) by mouth every 6 (six) hours as needed.    Dispense:  90 tablet    Refill:  2  . gabapentin (NEURONTIN) 100 MG capsule    Sig: Take 1 capsule (100 mg total) by mouth 3 (three) times daily.    Dispense:  90 capsule    Refill:  2   Patient's  Medications  New Prescriptions   No medications on file  Previous Medications   ADALIMUMAB (HUMIRA PEN) 40 MG/0.8ML PNKT    Inject 0.8 mLs into the skin every 14 (fourteen) days.   ADALIMUMAB 40 MG/0.8ML PNKT    Inject into the skin.   ALBUTEROL (PROVENTIL HFA;VENTOLIN HFA) 108 (90 BASE) MCG/ACT INHALER    Inhale 2 puffs into the lungs every 6 (six) hours as needed for wheezing or shortness of breath.   ALBUTEROL (PROVENTIL HFA;VENTOLIN HFA) 108 (90 BASE) MCG/ACT INHALER    Inhale into the lungs.   ALBUTEROL (PROVENTIL) (2.5 MG/3ML) 0.083% NEBULIZER SOLUTION    Inhale into the lungs.   CALCIUM CARBONATE-VIT D-MIN (CALCIUM 600 + MINERALS PO)    Take 600 mg by mouth 2 (two) times daily.   CLOBETASOL CREAM (TEMOVATE) 0.05 %    Apply 1 application topically 2 (two) times daily.   CLOBETASOL CREAM (TEMOVATE) 0.05 %    as needed.   CLOBETASOL PROP EMOLLIENT BASE (CLOBETASOL PROPIONATE E) 0.05 % EMOLLIENT CREAM    Apply topically.   DOCUSATE SODIUM (COLACE) 100 MG CAPSULE       DOCUSATE SODIUM (STOOL SOFTENER) 100 MG CAPSULE    Take 100 mg by mouth 2 (two) times daily.   DULOXETINE (CYMBALTA) 30 MG CAPSULE    Take by mouth.   DULOXETINE (CYMBALTA) 30 MG CAPSULE    Take by mouth.   ESOMEPRAZOLE MAGNESIUM (NEXIUM PO)    Take 22.3 mg by mouth daily.   ESOMEPRAZOLE MAGNESIUM 20 MG TBEC    Take by mouth.   HYDROXYCHLOROQUINE (PLAQUENIL) 200 MG TABLET    Take 200 mg by mouth daily.    MELOXICAM (MOBIC) 15 MG TABLET    Take 15 mg by mouth daily as needed for pain.   MELOXICAM (MOBIC) 15 MG TABLET    Take by mouth.   MULTIPLE VITAMINS-MINERALS (MULTIVITAMIN ADULT PO)    Take by mouth daily.   ONDANSETRON (ZOFRAN) 4 MG TABLET    4 mg as needed.    ONDANSETRON (ZOFRAN) 4 MG TABLET    Take 1 tablet (4 mg total) by mouth every 8 (eight) hours as needed for nausea or vomiting.   SULFASALAZINE (AZULFIDINE) 500 MG EC TABLET    Take 1,500 mg by mouth daily.    SULFASALAZINE (AZULFIDINE) 500 MG EC TABLET    TAKE 3  TABLETS BY MOUTH IN THE AM AND 2 TABLETS BY MOUTH AT NIGHT   SUMATRIPTAN (IMITREX) 25 MG TABLET    Take as needed by mouth.    TRIAMCINOLONE (KENALOG) 0.1 % PASTE    Apply to affected area(s) 2  times per day as needed  Modified Medications   Modified Medication Previous Medication   GABAPENTIN (NEURONTIN) 100 MG CAPSULE gabapentin (NEURONTIN) 100 MG capsule      Take 1 capsule (100 mg total) by mouth 3 (three) times daily.    Take 1 capsule (100 mg total) 3 (three) times daily by mouth.   TRAMADOL (ULTRAM) 50 MG TABLET traMADol (ULTRAM) 50 MG tablet      Take 1 tablet (50 mg total) by mouth every 6 (six) hours as needed.    Take 1 tablet (50 mg total) by mouth every 6 (six) hours as needed.  Discontinued Medications   No medications on file   ----------------------------------------------------------------------------------------------------------------------  Follow-up: Return in about 3 months (around 06/25/2017) for med refill, evaluation.    [name], MD

## 2017-03-28 NOTE — Patient Instructions (Signed)
You were given one prescription each for Tramadol and Gabapentin today.

## 2017-03-28 NOTE — Progress Notes (Signed)
Nursing Pain Medication Assessment:  Safety precautions to be maintained throughout the outpatient stay will include: orient to surroundings, keep bed in low position, maintain call bell within reach at all times, provide assistance with transfer out of bed and ambulation.  Medication Inspection Compliance: Pill count conducted under aseptic conditions, in front of the patient. Neither the pills nor the bottle was removed from the patient's sight at any time. Once count was completed pills were immediately returned to the patient in their original bottle.  Medication: Tramadol (Ultram) Pill/Patch Count: 3 of 90 pills remain Pill/Patch Appearance: Markings consistent with prescribed medication Bottle Appearance: Standard pharmacy container. Clearly labeled. Filled Date: 59 / 20 / 2018 Last Medication intake:  Today

## 2017-04-11 ENCOUNTER — Other Ambulatory Visit: Payer: Self-pay | Admitting: Family Medicine

## 2017-04-11 ENCOUNTER — Encounter: Payer: Self-pay | Admitting: Unknown Physician Specialty

## 2017-04-11 MED ORDER — METOCLOPRAMIDE HCL 10 MG PO TABS: 10.0000 mg | ORAL_TABLET | Freq: Four times a day (QID) | ORAL | 1 refills | Status: AC | PRN

## 2017-07-01 ENCOUNTER — Encounter: Payer: Self-pay | Admitting: Anesthesiology

## 2017-07-01 ENCOUNTER — Other Ambulatory Visit: Payer: Self-pay

## 2017-07-01 ENCOUNTER — Ambulatory Visit: Payer: Managed Care, Other (non HMO) | Attending: Anesthesiology | Admitting: Anesthesiology

## 2017-07-01 VITALS — BP 127/91 | HR 143 | Temp 98.2°F | Resp 18 | Ht 63.0 in | Wt 105.0 lb

## 2017-07-01 DIAGNOSIS — K1370 Unspecified lesions of oral mucosa: Secondary | ICD-10-CM | POA: Insufficient documentation

## 2017-07-01 DIAGNOSIS — M06031 Rheumatoid arthritis without rheumatoid factor, right wrist: Secondary | ICD-10-CM | POA: Insufficient documentation

## 2017-07-01 DIAGNOSIS — M352 Behcet's disease: Secondary | ICD-10-CM | POA: Insufficient documentation

## 2017-07-01 DIAGNOSIS — M06032 Rheumatoid arthritis without rheumatoid factor, left wrist: Secondary | ICD-10-CM

## 2017-07-01 DIAGNOSIS — M792 Neuralgia and neuritis, unspecified: Secondary | ICD-10-CM | POA: Diagnosis not present

## 2017-07-01 DIAGNOSIS — G894 Chronic pain syndrome: Secondary | ICD-10-CM | POA: Diagnosis not present

## 2017-07-01 DIAGNOSIS — Z791 Long term (current) use of non-steroidal anti-inflammatories (NSAID): Secondary | ICD-10-CM | POA: Insufficient documentation

## 2017-07-01 DIAGNOSIS — G629 Polyneuropathy, unspecified: Secondary | ICD-10-CM | POA: Diagnosis not present

## 2017-07-01 DIAGNOSIS — Z79899 Other long term (current) drug therapy: Secondary | ICD-10-CM | POA: Diagnosis not present

## 2017-07-01 DIAGNOSIS — M797 Fibromyalgia: Secondary | ICD-10-CM | POA: Diagnosis not present

## 2017-07-01 MED ORDER — GABAPENTIN 100 MG PO CAPS: 100.0000 mg | ORAL_CAPSULE | Freq: Three times a day (TID) | ORAL | 2 refills | Status: DC

## 2017-07-01 MED ORDER — TRAMADOL HCL 50 MG PO TABS: 50.0000 mg | ORAL_TABLET | Freq: Four times a day (QID) | ORAL | 2 refills | Status: DC | PRN

## 2017-07-01 NOTE — Progress Notes (Signed)
Nursing Pain Medication Assessment:  Safety precautions to be maintained throughout the outpatient stay will include: orient to surroundings, keep bed in low position, maintain call bell within reach at all times, provide assistance with transfer out of bed and ambulation.  Medication Inspection Compliance: Pill count conducted under aseptic conditions, in front of the patient. Neither the pills nor the bottle was removed from the patient's sight at any time. Once count was completed pills were immediately returned to the patient in their original bottle.  Medication: Tramadol (Ultram) Pill/Patch Count: 2 of 90 pills remain Pill/Patch Appearance: Markings consistent with prescribed medication Bottle Appearance: Standard pharmacy container. Clearly labeled. Filled Date: 04 / 05 / 2019 Last Medication intake:  Today

## 2017-07-01 NOTE — Progress Notes (Signed)
Subjective:  Patient ID: Rebekah Martin, female    DOB: September 11, 1975  Age: 42 y.o. MRN: [id number]  CC: Mouth Lesions   Procedure: None  HPI Rebekah Martin presents for reevaluation.  She was last seen 3 months ago and is been doing well in regards to her systemic pain from her arthritis.  She is taking her Ultram and Neurontin as scheduled and doing well with this regimen.  Based on her narcotic assessment sheet she continues to derive good functional lifestyle improvement and pain relief with the medications.  Otherwise she is in her usual state of health today with no change in symptom quality characteristic or distribution.  Outpatient Medications Prior to Visit  Medication Sig Dispense Refill  . Adalimumab (HUMIRA PEN) 40 MG/0.8ML PNKT Inject 0.8 mLs into the skin every 14 (fourteen) days. 2 each 11  . albuterol (PROVENTIL HFA;VENTOLIN HFA) 108 (90 Base) MCG/ACT inhaler Inhale 2 puffs into the lungs every 6 (six) hours as needed for wheezing or shortness of breath. 1 Inhaler 0  . Calcium Carbonate-Vit D-Min (CALCIUM 600 + MINERALS PO) Take 600 mg by mouth 2 (two) times daily.    . clobetasol cream (TEMOVATE) 0.05 % Apply 1 application topically 2 (two) times daily. 60 g 0  . docusate sodium (STOOL SOFTENER) 100 MG capsule Take 100 mg by mouth 2 (two) times daily.    . DULoxetine (CYMBALTA) 30 MG capsule Take by mouth.    . Esomeprazole Magnesium (NEXIUM PO) Take 22.3 mg by mouth daily.    . meloxicam (MOBIC) 15 MG tablet Take 15 mg by mouth daily as needed for pain.    . Multiple Vitamins-Minerals (MULTIVITAMIN ADULT PO) Take by mouth daily.    . ondansetron (ZOFRAN) 4 MG tablet Take 1 tablet (4 mg total) by mouth every 8 (eight) hours as needed for nausea or vomiting. 30 tablet 0  . sulfaSALAzine (AZULFIDINE) 500 MG EC tablet Take 1,500 mg by mouth daily.   3  . SUMAtriptan (IMITREX) 25 MG tablet Take as needed by mouth.     . triamcinolone (KENALOG) 0.1 % paste Apply to affected area(s)  2 times per day as needed    . docusate sodium (COLACE) 100 MG capsule     . gabapentin (NEURONTIN) 100 MG capsule Take 1 capsule (100 mg total) by mouth 3 (three) times daily. 90 capsule 2  . traMADol (ULTRAM) 50 MG tablet Take 1 tablet (50 mg total) by mouth every 6 (six) hours as needed. 90 tablet 2  . Adalimumab 40 MG/0.8ML PNKT Inject into the skin.    Marland Kitchen albuterol (PROVENTIL HFA;VENTOLIN HFA) 108 (90 Base) MCG/ACT inhaler Inhale into the lungs.    Marland Kitchen albuterol (PROVENTIL) (2.5 MG/3ML) 0.083% nebulizer solution Inhale into the lungs.    . Clobetasol Prop Emollient Base (CLOBETASOL PROPIONATE E) 0.05 % emollient cream Apply topically.    . Esomeprazole Magnesium 20 MG TBEC Take by mouth.    . meloxicam (MOBIC) 15 MG tablet Take by mouth.    . metoCLOPramide (REGLAN) 10 MG tablet Take 1 tablet (10 mg total) by mouth every 6 (six) hours as needed for nausea. (Patient not taking: Reported on 07/01/2017) 40 tablet 1  . ondansetron (ZOFRAN) 4 MG tablet 4 mg as needed.     . sulfaSALAzine (AZULFIDINE) 500 MG EC tablet TAKE 3 TABLETS BY MOUTH IN THE AM AND 2 TABLETS BY MOUTH AT NIGHT    . clobetasol cream (TEMOVATE) 0.05 % as needed.    Marland Kitchen  DULoxetine (CYMBALTA) 30 MG capsule Take by mouth.     Facility-Administered Medications Prior to Visit  Medication Dose Route Frequency Provider Last Rate Last Dose  . albuterol (PROVENTIL) (2.5 MG/3ML) 0.083% nebulizer solution 2.5 mg  2.5 mg Nebulization Once Particia Nearing, PA-C        Review of Systems CNS: No confusion or sedation Cardiac: No angina or palpitations GI: No abdominal pain or constipation Constitutional: No nausea vomiting fevers or chills  Objective:  BP (!) 127/91   Pulse (!) 143   Temp 98.2 F (36.8 C)   Resp 18   Ht 5\' 3"  (1.6 m)   Wt 105 lb (47.6 kg)   LMP 06/06/2017   SpO2 98%   BMI 18.60 kg/m    BP Readings from Last 3 Encounters:  07/01/17 (!) 127/91  03/28/17 130/85  01/01/17 116/75     Wt Readings from  Last 3 Encounters:  07/01/17 105 lb (47.6 kg)  03/28/17 105 lb (47.6 kg)  01/01/17 106 lb (48.1 kg)     Physical Exam Pt is alert and oriented PERRL EOMI HEART IS RRR no murmur or rub LCTA no wheezing or rales MUSCULOSKELETAL reveals some paraspinous muscle tenderness in the thoracic and lumbar region but her muscle tone and bulk is good she is ambulating well.  Labs  No results found for: HGBA1C No results found for: GLUF, MICROALBUR, LDLCALC, CREATININE  -------------------------------------------------------------------------------------------------------------------- No results found for: WBC, HGB, HCT, PLT, GLUCOSE, CHOL, TRIG, HDL, LDLDIRECT, LDLCALC, ALT, AST, NA, K, CL, CREATININE, BUN, CO2, TSH, PSA, INR, GLUF, HGBA1C, MICROALBUR  --------------------------------------------------------------------------------------------------------------------- Dg Chest 2 View  Result Date: 02/15/2016 CLINICAL DATA:  Cough for 10 days EXAM: CHEST  2 VIEW COMPARISON:  None. FINDINGS: Cardiomediastinal silhouette is unremarkable. No infiltrate or pleural effusion. No pulmonary edema. Mild hyperinflation. Bony thorax unremarkable. IMPRESSION: No active cardiopulmonary disease.  Mild hyperinflation Electronically Signed   By: Natasha Mead M.D.   On: 02/15/2016 13:37     Assessment & Plan:   Rebekah Martin was seen today for mouth lesions.  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 -     gabapentin (NEURONTIN) 100 MG capsule; Take 1 capsule (100 mg total) by mouth 3 (three) times  daily.        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 Fibromyalgia syndrome   2. 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  Will refill her medications for the next 3 months on both the Ultram and Neurontin.  3. Chronic pain syndrome Have reviewed the [facility] practitioner database information and it is appropriate.  4. Behcet's syndrome (HCC) Continue follow-up with her primary care physicians for baseline medical care.  5. Neuropathic pain As above with return to clinic in 3 months.    ----------------------------------------------------------------------------------------------------------------------  I am having Rebekah Martin maintain her meloxicam, Calcium Carbonate-Vit D-Min (CALCIUM 600 + MINERALS PO), docusate sodium, Multiple Vitamins-Minerals (MULTIVITAMIN ADULT PO), sulfaSALAzine, SUMAtriptan, triamcinolone, Esomeprazole Magnesium (NEXIUM PO), albuterol, Adalimumab, clobetasol cream, Clobetasol Prop Emollient Base, Adalimumab, albuterol, albuterol, DULoxetine, Esomeprazole Magnesium, meloxicam, ondansetron, sulfaSALAzine, ondansetron, metoCLOPramide, traMADol, and gabapentin. We will continue to administer albuterol.   Meds ordered this encounter  Medications  . traMADol (ULTRAM) 50 MG tablet    Sig: Take 1 tablet (50 mg total) by mouth  every 6 (six) hours as needed.    Dispense:  90 tablet    Refill:  2  . gabapentin (NEURONTIN) 100 MG capsule    Sig: Take 1 capsule (100 mg total) by mouth 3 (three) times daily.    Dispense:  90 capsule    Refill:  2   Patient's Medications  New Prescriptions   No medications on file  Previous Medications   ADALIMUMAB (HUMIRA PEN) 40 MG/0.8ML PNKT    Inject 0.8 mLs into the skin every 14 (fourteen) days.   ADALIMUMAB 40 MG/0.8ML PNKT    Inject into the skin.   ALBUTEROL (PROVENTIL HFA;VENTOLIN HFA) 108 (90 BASE) MCG/ACT INHALER    Inhale 2 puffs into the lungs every 6 (six) hours as needed for wheezing or shortness of breath.   ALBUTEROL (PROVENTIL HFA;VENTOLIN HFA) 108 (90 BASE) MCG/ACT INHALER    Inhale into the lungs.   ALBUTEROL (PROVENTIL) (2.5 MG/3ML) 0.083% NEBULIZER SOLUTION    Inhale into the lungs.   CALCIUM CARBONATE-VIT D-MIN (CALCIUM 600 + MINERALS PO)    Take 600 mg by mouth 2 (two) times daily.   CLOBETASOL CREAM (TEMOVATE) 0.05 %    Apply 1 application topically 2 (two) times daily.   CLOBETASOL PROP EMOLLIENT BASE (CLOBETASOL PROPIONATE E) 0.05 % EMOLLIENT CREAM    Apply topically.   DOCUSATE SODIUM (STOOL SOFTENER) 100 MG CAPSULE    Take 100 mg by mouth 2 (two) times daily.   DULOXETINE (CYMBALTA) 30 MG CAPSULE    Take by mouth.   ESOMEPRAZOLE MAGNESIUM (NEXIUM PO)    Take 22.3 mg by mouth daily.   ESOMEPRAZOLE MAGNESIUM 20 MG TBEC    Take by mouth.   MELOXICAM (MOBIC) 15 MG TABLET    Take 15 mg by mouth daily as needed for pain.   MELOXICAM (MOBIC) 15 MG TABLET    Take by mouth.   METOCLOPRAMIDE (REGLAN) 10 MG TABLET    Take 1 tablet (10 mg total) by mouth every 6 (six) hours as needed for nausea.   MULTIPLE VITAMINS-MINERALS (MULTIVITAMIN ADULT PO)    Take by mouth daily.   ONDANSETRON (ZOFRAN) 4 MG TABLET    4 mg as needed.    ONDANSETRON (ZOFRAN) 4 MG TABLET    Take 1 tablet (4 mg total) by mouth every 8 (eight) hours as needed for nausea or vomiting.    SULFASALAZINE (AZULFIDINE) 500 MG EC TABLET    Take 1,500 mg by mouth daily.    SULFASALAZINE (AZULFIDINE) 500 MG EC TABLET    TAKE 3 TABLETS BY MOUTH IN THE AM AND 2 TABLETS BY MOUTH AT NIGHT   SUMATRIPTAN (IMITREX) 25 MG TABLET    Take as needed by mouth.    TRIAMCINOLONE (KENALOG) 0.1 % PASTE    Apply to affected area(s) 2 times per day as needed  Modified Medications   Modified Medication Previous  Medication   GABAPENTIN (NEURONTIN) 100 MG CAPSULE gabapentin (NEURONTIN) 100 MG capsule      Take 1 capsule (100 mg total) by mouth 3 (three) times daily.    Take 1 capsule (100 mg total) by mouth 3 (three) times daily.   TRAMADOL (ULTRAM) 50 MG TABLET traMADol (ULTRAM) 50 MG tablet      Take 1 tablet (50 mg total) by mouth every 6 (six) hours as needed.    Take 1 tablet (50 mg total) by mouth every 6 (six) hours as needed.  Discontinued Medications   CLOBETASOL CREAM (TEMOVATE) 0.05 %    as needed.   DOCUSATE SODIUM (COLACE) 100 MG CAPSULE       DULOXETINE (CYMBALTA) 30 MG CAPSULE    Take by mouth.   ----------------------------------------------------------------------------------------------------------------------  Follow-up: Return in about 3 months (around 10/01/2017) for evaluation, med refill.    [name], MD

## 2017-09-26 ENCOUNTER — Encounter: Payer: Self-pay | Admitting: Unknown Physician Specialty

## 2017-10-03 ENCOUNTER — Ambulatory Visit: Payer: Managed Care, Other (non HMO) | Attending: Anesthesiology | Admitting: Anesthesiology

## 2017-10-03 ENCOUNTER — Encounter: Payer: Self-pay | Admitting: Anesthesiology

## 2017-10-03 ENCOUNTER — Other Ambulatory Visit: Payer: Self-pay

## 2017-10-03 VITALS — BP 133/93 | HR 102 | Temp 98.3°F | Resp 16 | Ht 63.0 in | Wt 105.0 lb

## 2017-10-03 DIAGNOSIS — M797 Fibromyalgia: Secondary | ICD-10-CM

## 2017-10-03 DIAGNOSIS — G629 Polyneuropathy, unspecified: Secondary | ICD-10-CM | POA: Diagnosis not present

## 2017-10-03 DIAGNOSIS — Z79891 Long term (current) use of opiate analgesic: Secondary | ICD-10-CM | POA: Insufficient documentation

## 2017-10-03 DIAGNOSIS — F1190 Opioid use, unspecified, uncomplicated: Secondary | ICD-10-CM

## 2017-10-03 DIAGNOSIS — Z79899 Other long term (current) drug therapy: Secondary | ICD-10-CM | POA: Diagnosis not present

## 2017-10-03 DIAGNOSIS — M06031 Rheumatoid arthritis without rheumatoid factor, right wrist: Secondary | ICD-10-CM

## 2017-10-03 DIAGNOSIS — G894 Chronic pain syndrome: Secondary | ICD-10-CM

## 2017-10-03 DIAGNOSIS — M792 Neuralgia and neuritis, unspecified: Secondary | ICD-10-CM

## 2017-10-03 DIAGNOSIS — M25561 Pain in right knee: Secondary | ICD-10-CM | POA: Diagnosis present

## 2017-10-03 DIAGNOSIS — M06032 Rheumatoid arthritis without rheumatoid factor, left wrist: Secondary | ICD-10-CM | POA: Diagnosis not present

## 2017-10-03 DIAGNOSIS — M352 Behcet's disease: Secondary | ICD-10-CM

## 2017-10-03 DIAGNOSIS — M546 Pain in thoracic spine: Secondary | ICD-10-CM | POA: Insufficient documentation

## 2017-10-03 DIAGNOSIS — Z791 Long term (current) use of non-steroidal anti-inflammatories (NSAID): Secondary | ICD-10-CM | POA: Insufficient documentation

## 2017-10-03 MED ORDER — GABAPENTIN 100 MG PO CAPS: 100.0000 mg | ORAL_CAPSULE | Freq: Three times a day (TID) | ORAL | 2 refills | Status: DC

## 2017-10-03 MED ORDER — TRAMADOL HCL 50 MG PO TABS: 50.0000 mg | ORAL_TABLET | Freq: Four times a day (QID) | ORAL | 2 refills | Status: DC | PRN

## 2017-10-03 NOTE — Progress Notes (Signed)
Subjective:  Patient ID: Rebekah Martin, female    DOB: Jul 17, 1975  Age: 42 y.o. MRN: [id number]  CC: Knee Pain (right)   Procedure: None  HPI Rebekah Martin presents for reevaluation.  She is been doing well in regards to her thoracic pain since our last visit.  The quality characteristic distribution has been stable in nature with no significant changes reported.  Her upper and lower extremity strength is stable and she is working on her physical therapy exercises.  Based on her narcotic assessment sheet she continues to derive good functional improvement with her tramadol.  She is taking this regularly as scheduled with no diverting or illicit use.  She is also taking her Neurontin with no side effects noted.  Outpatient Medications Prior to Visit  Medication Sig Dispense Refill  . Adalimumab (HUMIRA PEN) 40 MG/0.8ML PNKT Inject 0.8 mLs into the skin every 14 (fourteen) days. 2 each 11  . Adalimumab 40 MG/0.8ML PNKT Inject into the skin.    Marland Kitchen albuterol (PROVENTIL HFA;VENTOLIN HFA) 108 (90 Base) MCG/ACT inhaler Inhale 2 puffs into the lungs every 6 (six) hours as needed for wheezing or shortness of breath. 1 Inhaler 0  . albuterol (PROVENTIL HFA;VENTOLIN HFA) 108 (90 Base) MCG/ACT inhaler Inhale into the lungs.    . Calcium Carbonate-Vit D-Min (CALCIUM 600 + MINERALS PO) Take 600 mg by mouth 2 (two) times daily.    . clobetasol cream (TEMOVATE) 0.05 % Apply 1 application topically 2 (two) times daily. 60 g 0  . Clobetasol Prop Emollient Base (CLOBETASOL PROPIONATE E) 0.05 % emollient cream Apply topically.    . docusate sodium (STOOL SOFTENER) 100 MG capsule Take 100 mg by mouth 2 (two) times daily.    . DULoxetine (CYMBALTA) 30 MG capsule Take by mouth.    . Esomeprazole Magnesium (NEXIUM PO) Take 22.3 mg by mouth daily.    . meloxicam (MOBIC) 15 MG tablet Take 15 mg by mouth daily as needed for pain.    Marland Kitchen metoCLOPramide (REGLAN) 10 MG tablet Take 1 tablet (10 mg total) by mouth every 6  (six) hours as needed for nausea. 40 tablet 1  . Multiple Vitamins-Minerals (MULTIVITAMIN ADULT PO) Take by mouth daily.    . ondansetron (ZOFRAN) 4 MG tablet Take 1 tablet (4 mg total) by mouth every 8 (eight) hours as needed for nausea or vomiting. 30 tablet 0  . sulfaSALAzine (AZULFIDINE) 500 MG EC tablet TAKE 3 TABLETS BY MOUTH IN THE AM AND 2 TABLETS BY MOUTH AT NIGHT    . SUMAtriptan (IMITREX) 25 MG tablet Take as needed by mouth.     . triamcinolone (KENALOG) 0.1 % paste Apply to affected area(s) 2 times per day as needed    . gabapentin (NEURONTIN) 100 MG capsule Take 1 capsule (100 mg total) by mouth 3 (three) times daily. 90 capsule 2  . traMADol (ULTRAM) 50 MG tablet Take 1 tablet (50 mg total) by mouth every 6 (six) hours as needed. 90 tablet 2  . albuterol (PROVENTIL) (2.5 MG/3ML) 0.083% nebulizer solution Inhale into the lungs.    . Esomeprazole Magnesium 20 MG TBEC Take by mouth.    . meloxicam (MOBIC) 15 MG tablet Take by mouth.    . ondansetron (ZOFRAN) 4 MG tablet 4 mg as needed.     . sulfaSALAzine (AZULFIDINE) 500 MG EC tablet Take 1,500 mg by mouth daily.   3   Facility-Administered Medications Prior to Visit  Medication Dose Route Frequency Provider Last  Rate Last Dose  . albuterol (PROVENTIL) (2.5 MG/3ML) 0.083% nebulizer solution 2.5 mg  2.5 mg Nebulization Once Particia Nearing, PA-C        Review of Systems CNS: No confusion or sedation Cardiac: No angina or palpitations GI: No abdominal pain or constipation Constitutional: No nausea vomiting fevers or chills  Objective:  BP (!) 133/93   Pulse (!) 102   Temp 98.3 F (36.8 C) (Oral)   Resp 16   Ht 5\' 3"  (1.6 m)   Wt 105 lb (47.6 kg)   LMP 09/06/2017   SpO2 100%   BMI 18.60 kg/m    BP Readings from Last 3 Encounters:  10/03/17 (!) 133/93  07/01/17 (!) 127/91  03/28/17 130/85     Wt Readings from Last 3 Encounters:  10/03/17 105 lb (47.6 kg)  07/01/17 105 lb (47.6 kg)  03/28/17 105 lb  (47.6 kg)     Physical Exam Pt is alert and oriented PERRL EOMI HEART IS RRR no murmur or rub LCTA no wheezing or rales MUSCULOSKELETAL reveals some mild paraspinous muscle tenderness in the thoracic region but no change from baseline examination.  Her upper and lower extremity muscle tone and bulk appears appropriate and stable in nature.  Labs  No results found for: HGBA1C No results found for: GLUF, MICROALBUR, LDLCALC, CREATININE  -------------------------------------------------------------------------------------------------------------------- No results found for: WBC, HGB, HCT, PLT, GLUCOSE, CHOL, TRIG, HDL, LDLDIRECT, LDLCALC, ALT, AST, NA, K, CL, CREATININE, BUN, CO2, TSH, PSA, INR, GLUF, HGBA1C, MICROALBUR  --------------------------------------------------------------------------------------------------------------------- Dg Chest 2 View  Result Date: 02/15/2016 CLINICAL DATA:  Cough for 10 days EXAM: CHEST  2 VIEW COMPARISON:  None. FINDINGS: Cardiomediastinal silhouette is unremarkable. No infiltrate or pleural effusion. No pulmonary edema. Mild hyperinflation. Bony thorax unremarkable. IMPRESSION: No active cardiopulmonary disease.  Mild hyperinflation Electronically Signed   By: Natasha Mead M.D.   On: 02/15/2016 13:37     Assessment & Plan:   Rebekah Martin was seen today for knee pain.  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 -     traMADol (ULTRAM) 50 MG tablet; Take 1 tablet (50 mg total) by mouth every 6 (six) hours as  needed.        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 Fibromyalgia syndrome We will have her continue with her current stretching and physical therapy routine.  This seems to be working  well and she is maintaining good muscle tone and bulk.  We will also refill her medications for her tramadol today with 2 refills for 3 times daily dosing.  I am also refilling her Neurontin.  We will have her return to clinic in 3 months for reevaluation at that time and she is to contact us should she have any changes in her symptom complex in the meantime.  2. 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  3. Chronic pain syndrome UDS was requested today for baseline management - ToxASSURE Select 13 (MW), Urine  4. Behcet's syndrome (HCC)   5. Neuropathic pain   6. Chronic, continuous use of opioids We have reviewed the [facility] practitioner database information and it is appropriate. - ToxASSURE Select 13 (MW), Urine    ----------------------------------------------------------------------------------------------------------------------  I am having Rebekah Martin maintain her meloxicam,  Calcium Carbonate-Vit D-Min (CALCIUM 600 + MINERALS PO), docusate sodium, Multiple Vitamins-Minerals (MULTIVITAMIN ADULT PO), sulfaSALAzine, SUMAtriptan, triamcinolone, Esomeprazole Magnesium (NEXIUM PO), albuterol, Adalimumab, clobetasol cream, Clobetasol Prop Emollient Base, Adalimumab, albuterol, albuterol, DULoxetine, Esomeprazole Magnesium, meloxicam, ondansetron, sulfaSALAzine, ondansetron, metoCLOPramide, gabapentin, and traMADol. We will continue to administer albuterol.   Meds ordered this encounter  Medications  . gabapentin (NEURONTIN) 100 MG capsule    Sig: Take 1 capsule (100 mg total) by mouth 3 (three) times daily.    Dispense:  90 capsule    Refill:  2  . traMADol (ULTRAM) 50 MG tablet    Sig: Take 1 tablet (50 mg total) by mouth every 6 (six) hours as needed.    Dispense:  90 tablet    Refill:  2   Patient's Medications  New Prescriptions   No medications on file  Previous Medications   ADALIMUMAB (HUMIRA PEN) 40 MG/0.8ML PNKT    Inject 0.8 mLs into the skin every 14 (fourteen) days.   ADALIMUMAB 40 MG/0.8ML PNKT    Inject into the skin.   ALBUTEROL (PROVENTIL HFA;VENTOLIN HFA) 108 (90 BASE) MCG/ACT INHALER    Inhale 2 puffs into the lungs every 6 (six) hours as needed for wheezing or shortness of breath.   ALBUTEROL (PROVENTIL HFA;VENTOLIN HFA) 108 (90 BASE) MCG/ACT INHALER    Inhale into the lungs.   ALBUTEROL (PROVENTIL) (2.5 MG/3ML) 0.083% NEBULIZER SOLUTION    Inhale into the lungs.   CALCIUM CARBONATE-VIT D-MIN (CALCIUM 600 + MINERALS PO)    Take 600 mg by mouth 2 (two) times daily.   CLOBETASOL CREAM (TEMOVATE) 0.05 %    Apply 1 application topically 2 (two) times daily.   CLOBETASOL PROP EMOLLIENT BASE (CLOBETASOL PROPIONATE E) 0.05 % EMOLLIENT CREAM    Apply topically.   DOCUSATE SODIUM (STOOL SOFTENER) 100 MG CAPSULE    Take 100 mg by mouth 2 (two) times daily.   DULOXETINE (CYMBALTA) 30 MG CAPSULE    Take by mouth.   ESOMEPRAZOLE MAGNESIUM (NEXIUM PO)    Take  22.3 mg by mouth daily.   ESOMEPRAZOLE MAGNESIUM 20 MG TBEC    Take by mouth.   MELOXICAM (MOBIC) 15 MG TABLET    Take 15 mg by mouth daily as needed for pain.   MELOXICAM (MOBIC) 15 MG TABLET    Take by mouth.   METOCLOPRAMIDE (REGLAN) 10 MG TABLET    Take 1 tablet (10 mg total) by mouth every 6 (six) hours as needed for nausea.   MULTIPLE VITAMINS-MINERALS (MULTIVITAMIN ADULT PO)    Take by  mouth daily.   ONDANSETRON (ZOFRAN) 4 MG TABLET    4 mg as needed.    ONDANSETRON (ZOFRAN) 4 MG TABLET    Take 1 tablet (4 mg total) by mouth every 8 (eight) hours as needed for nausea or vomiting.   SULFASALAZINE (AZULFIDINE) 500 MG EC TABLET    Take 1,500 mg by mouth daily.    SULFASALAZINE (AZULFIDINE) 500 MG EC TABLET    TAKE 3 TABLETS BY MOUTH IN THE AM AND 2 TABLETS BY MOUTH AT NIGHT   SUMATRIPTAN (IMITREX) 25 MG TABLET    Take as needed by mouth.    TRIAMCINOLONE (KENALOG) 0.1 % PASTE    Apply to affected area(s) 2 times per day as needed  Modified Medications   Modified Medication Previous Medication   GABAPENTIN (NEURONTIN) 100 MG CAPSULE gabapentin (NEURONTIN) 100 MG capsule      Take 1 capsule (100 mg total) by mouth 3 (three) times daily.    Take 1 capsule (100 mg total) by mouth 3 (three) times daily.   TRAMADOL (ULTRAM) 50 MG TABLET traMADol (ULTRAM) 50 MG tablet      Take 1 tablet (50 mg total) by mouth every 6 (six) hours as needed.    Take 1 tablet (50 mg total) by mouth every 6 (six) hours as needed.  Discontinued Medications   No medications on file   ----------------------------------------------------------------------------------------------------------------------  Follow-up: Return in about 3 months (around 01/03/2018) for evaluation, med refill.    [name], MD

## 2017-10-03 NOTE — Progress Notes (Signed)
Nursing Pain Medication Assessment:  Safety precautions to be maintained throughout the outpatient stay will include: orient to surroundings, keep bed in low position, maintain call bell within reach at all times, provide assistance with transfer out of bed and ambulation.  Medication Inspection Compliance: Pill count conducted under aseptic conditions, in front of the patient. Neither the pills nor the bottle was removed from the patient's sight at any time. Once count was completed pills were immediately returned to the patient in their original bottle.  Medication: Tramadol (Ultram) Pill/Patch Count: 5 of 90 pills remain Pill/Patch Appearance: Markings consistent with prescribed medication Bottle Appearance: Standard pharmacy container. Clearly labeled. Filled Date: 07 / 05 / 2019 Last Medication intake:  Today

## 2017-10-08 LAB — TOXASSURE SELECT 13 (MW), URINE

## 2018-01-06 ENCOUNTER — Encounter: Payer: Self-pay | Admitting: Anesthesiology

## 2018-01-06 ENCOUNTER — Ambulatory Visit: Payer: Managed Care, Other (non HMO) | Attending: Anesthesiology | Admitting: Anesthesiology

## 2018-01-06 ENCOUNTER — Other Ambulatory Visit: Payer: Self-pay

## 2018-01-06 VITALS — BP 122/82 | HR 141 | Temp 98.0°F | Resp 16 | Ht 64.0 in | Wt 105.0 lb

## 2018-01-06 DIAGNOSIS — M06031 Rheumatoid arthritis without rheumatoid factor, right wrist: Secondary | ICD-10-CM

## 2018-01-06 DIAGNOSIS — M545 Low back pain: Secondary | ICD-10-CM | POA: Insufficient documentation

## 2018-01-06 DIAGNOSIS — M797 Fibromyalgia: Secondary | ICD-10-CM | POA: Diagnosis not present

## 2018-01-06 DIAGNOSIS — M352 Behcet's disease: Secondary | ICD-10-CM | POA: Diagnosis not present

## 2018-01-06 DIAGNOSIS — G894 Chronic pain syndrome: Secondary | ICD-10-CM

## 2018-01-06 DIAGNOSIS — M06032 Rheumatoid arthritis without rheumatoid factor, left wrist: Secondary | ICD-10-CM

## 2018-01-06 DIAGNOSIS — M069 Rheumatoid arthritis, unspecified: Secondary | ICD-10-CM | POA: Diagnosis not present

## 2018-01-06 DIAGNOSIS — Z79891 Long term (current) use of opiate analgesic: Secondary | ICD-10-CM | POA: Insufficient documentation

## 2018-01-06 DIAGNOSIS — F1190 Opioid use, unspecified, uncomplicated: Secondary | ICD-10-CM

## 2018-01-06 DIAGNOSIS — M792 Neuralgia and neuritis, unspecified: Secondary | ICD-10-CM

## 2018-01-06 MED ORDER — TRAMADOL HCL 50 MG PO TABS: 50.0000 mg | ORAL_TABLET | Freq: Four times a day (QID) | ORAL | 1 refills | Status: DC | PRN

## 2018-01-06 NOTE — Patient Instructions (Signed)
You have been give tramadol prescription.

## 2018-01-06 NOTE — Progress Notes (Signed)
Nursing Pain Medication Assessment:  Safety precautions to be maintained throughout the outpatient stay will include: orient to surroundings, keep bed in low position, maintain call bell within reach at all times, provide assistance with transfer out of bed and ambulation.  Medication Inspection Compliance: Pill count conducted under aseptic conditions, in front of the patient. Neither the pills nor the bottle was removed from the patient's sight at any time. Once count was completed pills were immediately returned to the patient in their original bottle.  Medication: Tramadol (Ultram) Pill/Patch Count: 2 of 90 pills remain Pill/Patch Appearance: Markings consistent with prescribed medication Bottle Appearance: Standard pharmacy container. Clearly labeled. Filled Date: 10 / 04 / 2019 Last Medication intake:  Today

## 2018-01-10 NOTE — Progress Notes (Signed)
Subjective:  Patient ID: Rebekah Martin, female    DOB: [birth year]  Age: 42 y.o. MRN: [id number]  CC: Medication Refill   Procedure: None  HPI Rebekah Martin presents for reevaluation.  She was last seen 3 months ago and continues to have some low back pain with radiation into the bilateral lower extremities.  She has history of fibromyalgia and she takes tramadol.  She is had some gradual worsening of her low back pain over the last few months.  She takes tramadol 3 times a day and this does help but she gets frequent breakthrough after about 4 hours after dosing.  Otherwise the quality characteristic and distribution of the pain have been stable in nature with no change in upper or lower extremity strength or function.  Bowel bladder function is been stable as well and otherwise is been in her usual state of health.  Outpatient Medications Prior to Visit  Medication Sig Dispense Refill  . Adalimumab (HUMIRA PEN) 40 MG/0.8ML PNKT Inject 0.8 mLs into the skin every 14 (fourteen) days. 2 each 11  . albuterol (PROVENTIL) (2.5 MG/3ML) 0.083% nebulizer solution Inhale into the lungs.    . Calcium Carbonate-Vit D-Min (CALCIUM 600 + MINERALS PO) Take 600 mg by mouth 2 (two) times daily.    . clobetasol cream (TEMOVATE) 0.05 % Apply 1 application topically 2 (two) times daily. 60 g 0  . Clobetasol Prop Emollient Base (CLOBETASOL PROPIONATE E) 0.05 % emollient cream Apply topically.    . docusate sodium (STOOL SOFTENER) 100 MG capsule Take 100 mg by mouth 2 (two) times daily.    . DULoxetine (CYMBALTA) 30 MG capsule Take by mouth.    . gabapentin (NEURONTIN) 100 MG capsule Take 1 capsule (100 mg total) by mouth 3 (three) times daily. 90 capsule 2  . meloxicam (MOBIC) 15 MG tablet Take 15 mg by mouth daily as needed for pain.    Marland Kitchen metoCLOPramide (REGLAN) 10 MG tablet Take 1 tablet (10 mg total) by mouth every 6 (six) hours as needed for nausea. 40 tablet 1  . Multiple Vitamins-Minerals (MULTIVITAMIN  ADULT PO) Take by mouth daily.    . ondansetron (ZOFRAN) 4 MG tablet 4 mg as needed.     . ranitidine (ZANTAC) 75 MG tablet Take 75 mg by mouth 2 (two) times daily.    Marland Kitchen sulfaSALAzine (AZULFIDINE) 500 MG EC tablet Take 1,500 mg by mouth daily.   3  . SUMAtriptan (IMITREX) 25 MG tablet Take as needed by mouth.     . triamcinolone (KENALOG) 0.1 % paste Apply to affected area(s) 2 times per day as needed    . sulfaSALAzine (AZULFIDINE) 500 MG EC tablet TAKE 3 TABLETS BY MOUTH IN THE AM AND 2 TABLETS BY MOUTH AT NIGHT    . traMADol (ULTRAM) 50 MG tablet Take 1 tablet (50 mg total) by mouth every 6 (six) hours as needed. 90 tablet 2  . Adalimumab 40 MG/0.8ML PNKT Inject into the skin.    Marland Kitchen albuterol (PROVENTIL HFA;VENTOLIN HFA) 108 (90 Base) MCG/ACT inhaler Inhale into the lungs.    Marland Kitchen albuterol (PROVENTIL HFA;VENTOLIN HFA) 108 (90 Base) MCG/ACT inhaler Inhale 2 puffs into the lungs every 6 (six) hours as needed for wheezing or shortness of breath. 1 Inhaler 0  . Esomeprazole Magnesium (NEXIUM PO) Take 22.3 mg by mouth daily.    . Esomeprazole Magnesium 20 MG TBEC Take by mouth.    . meloxicam (MOBIC) 15 MG tablet Take by mouth.    Marland Kitchen  ondansetron (ZOFRAN) 4 MG tablet Take 1 tablet (4 mg total) by mouth every 8 (eight) hours as needed for nausea or vomiting. 30 tablet 0   Facility-Administered Medications Prior to Visit  Medication Dose Route Frequency Provider Last Rate Last Dose  . albuterol (PROVENTIL) (2.5 MG/3ML) 0.083% nebulizer solution 2.5 mg  2.5 mg Nebulization Once Particia Nearing, PA-C        Review of Systems CNS: No confusion or sedation Cardiac: No angina or palpitations GI: No abdominal pain or constipation Constitutional: No nausea vomiting fevers or chills  Objective:  BP 122/82   Pulse (!) 141   Temp 98 F (36.7 C)   Resp 16   Ht 5\' 4"  (1.626 m)   Wt 105 lb (47.6 kg)   LMP 01/03/2018 (Exact Date)   SpO2 99%   BMI 18.02 kg/m    BP Readings from Last 3  Encounters:  01/06/18 122/82  10/03/17 (!) 133/93  07/01/17 (!) 127/91     Wt Readings from Last 3 Encounters:  01/06/18 105 lb (47.6 kg)  10/03/17 105 lb (47.6 kg)  07/01/17 105 lb (47.6 kg)     Physical Exam Pt is alert and oriented PERRL EOMI HEART IS RRR no murmur or rub LCTA no wheezing or rales MUSCULOSKELETAL reveals some paraspinous muscle tenderness in the lumbar and thoracic spine.  She has good muscle tone and bulk to the lower extremities this is at baseline.  Labs  No results found for: HGBA1C No results found for: GLUF, MICROALBUR, LDLCALC, CREATININE  -------------------------------------------------------------------------------------------------------------------- No results found for: WBC, HGB, HCT, PLT, GLUCOSE, CHOL, TRIG, HDL, LDLDIRECT, LDLCALC, ALT, AST, NA, K, CL, CREATININE, BUN, CO2, TSH, PSA, INR, GLUF, HGBA1C, MICROALBUR  --------------------------------------------------------------------------------------------------------------------- Dg Chest 2 View  Result Date: 02/15/2016 CLINICAL DATA:  Cough for 10 days EXAM: CHEST  2 VIEW COMPARISON:  None. FINDINGS: Cardiomediastinal silhouette is unremarkable. No infiltrate or pleural effusion. No pulmonary edema. Mild hyperinflation. Bony thorax unremarkable. IMPRESSION: No active cardiopulmonary disease.  Mild hyperinflation Electronically Signed   By: Natasha Mead M.D.   On: 02/15/2016 13:37     Assessment & Plan:   Rebekah Martin was seen today for medication refill.  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 Fibromyalgia syndrome I am going to increase her tramadol to 4 times a day with refills given today for the next 2 months and return to clinic in 3 months.  I want her to continue with her current physical therapy modality.  We have talked about possible epidural steroid  injection for her low back pain if indicated.  2. 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  3. Chronic pain syndrome We have reviewed the [facility] practitioner database information and it is appropriate.  4. Behcet's syndrome (HCC) Continue follow-up with primary care  5. Neuropathic pain As above  6. Chronic, continuous use of opioids As above    ----------------------------------------------------------------------------------------------------------------------  I have discontinued Bill J. Wilbert's Esomeprazole Magnesium (NEXIUM PO) and Esomeprazole Magnesium. I am also having her maintain her meloxicam, Calcium Carbonate-Vit D-Min (CALCIUM 600 + MINERALS PO), docusate sodium, Multiple Vitamins-Minerals (MULTIVITAMIN ADULT PO), sulfaSALAzine, SUMAtriptan, triamcinolone, Adalimumab, clobetasol cream, Clobetasol Prop Emollient Base,  Adalimumab, albuterol, albuterol, DULoxetine, ondansetron, metoCLOPramide, gabapentin, ranitidine, and traMADol. We will continue to administer albuterol.   Meds ordered this encounter  Medications  . traMADol (ULTRAM) 50 MG tablet    Sig: Take 1 tablet (50 mg total) by mouth every 6 (six) hours as needed.    Dispense:  120 tablet    Refill:  1   Patient's Medications  New Prescriptions   No medications on file  Previous Medications   ADALIMUMAB (HUMIRA PEN) 40 MG/0.8ML PNKT    Inject 0.8 mLs into the skin every 14 (fourteen) days.   ADALIMUMAB 40 MG/0.8ML PNKT    Inject into the skin.   ALBUTEROL (PROVENTIL HFA;VENTOLIN HFA) 108 (90 BASE) MCG/ACT INHALER    Inhale into the lungs.   ALBUTEROL (PROVENTIL) (2.5 MG/3ML) 0.083% NEBULIZER SOLUTION    Inhale into the lungs.   CALCIUM CARBONATE-VIT D-MIN (CALCIUM 600 + MINERALS PO)    Take 600 mg by mouth 2 (two) times daily.   CLOBETASOL CREAM (TEMOVATE) 0.05 %    Apply 1 application topically 2 (two) times daily.   CLOBETASOL PROP EMOLLIENT BASE (CLOBETASOL PROPIONATE E) 0.05 % EMOLLIENT CREAM    Apply topically.   DOCUSATE SODIUM (STOOL SOFTENER) 100 MG CAPSULE    Take 100 mg by mouth 2 (two) times daily.   DULOXETINE (CYMBALTA) 30 MG CAPSULE    Take by mouth.   GABAPENTIN (NEURONTIN) 100 MG CAPSULE    Take 1 capsule (100 mg total) by mouth 3 (three) times daily.   MELOXICAM (MOBIC) 15 MG TABLET    Take 15 mg by mouth daily as needed for pain.   METOCLOPRAMIDE (REGLAN) 10 MG TABLET    Take 1 tablet (10 mg total) by mouth every 6 (six) hours as needed for nausea.   MULTIPLE VITAMINS-MINERALS (MULTIVITAMIN ADULT PO)    Take by mouth daily.   ONDANSETRON (ZOFRAN) 4 MG TABLET    4 mg as needed.    RANITIDINE (ZANTAC) 75 MG TABLET    Take 75 mg by mouth 2 (two) times daily.   SULFASALAZINE (AZULFIDINE) 500 MG EC TABLET    Take 1,500 mg by mouth daily.    SUMATRIPTAN (IMITREX) 25 MG TABLET    Take as needed by mouth.    TRIAMCINOLONE (KENALOG)  0.1 % PASTE    Apply to affected area(s) 2 times per day as needed  Modified Medications   Modified Medication Previous Medication   TRAMADOL (ULTRAM) 50 MG TABLET traMADol (ULTRAM) 50 MG tablet      Take 1 tablet (50 mg total) by mouth every 6 (six) hours as needed.    Take 1 tablet (50 mg total) by mouth every 6 (six) hours as needed.  Discontinued Medications   ALBUTEROL (PROVENTIL  HFA;VENTOLIN HFA) 108 (90 BASE) MCG/ACT INHALER    Inhale 2 puffs into the lungs every 6 (six) hours as needed for wheezing or shortness of breath.   ESOMEPRAZOLE MAGNESIUM (NEXIUM PO)    Take 22.3 mg by mouth daily.   ESOMEPRAZOLE MAGNESIUM 20 MG TBEC    Take by mouth.   MELOXICAM (MOBIC) 15 MG TABLET    Take by mouth.   ONDANSETRON (ZOFRAN) 4 MG TABLET    Take 1 tablet (4 mg total) by mouth every 8 (eight) hours as needed for nausea or vomiting.   SULFASALAZINE (AZULFIDINE) 500 MG EC TABLET    TAKE 3 TABLETS BY MOUTH IN THE AM AND 2 TABLETS BY MOUTH AT NIGHT   ----------------------------------------------------------------------------------------------------------------------  Follow-up: Return in about 2 months (around 03/08/2018) for evaluation, med refill.    [name], MD

## 2018-02-17 ENCOUNTER — Other Ambulatory Visit: Payer: Self-pay | Admitting: Anesthesiology

## 2018-03-21 ENCOUNTER — Ambulatory Visit: Payer: Managed Care, Other (non HMO) | Attending: Anesthesiology | Admitting: Anesthesiology

## 2018-03-21 ENCOUNTER — Other Ambulatory Visit: Payer: Self-pay

## 2018-03-21 ENCOUNTER — Encounter: Payer: Self-pay | Admitting: Anesthesiology

## 2018-03-21 VITALS — BP 131/88 | HR 149 | Temp 97.7°F | Ht 63.0 in | Wt 105.0 lb

## 2018-03-21 DIAGNOSIS — G894 Chronic pain syndrome: Secondary | ICD-10-CM | POA: Diagnosis not present

## 2018-03-21 DIAGNOSIS — M792 Neuralgia and neuritis, unspecified: Secondary | ICD-10-CM

## 2018-03-21 DIAGNOSIS — M797 Fibromyalgia: Secondary | ICD-10-CM | POA: Insufficient documentation

## 2018-03-21 DIAGNOSIS — F1190 Opioid use, unspecified, uncomplicated: Secondary | ICD-10-CM | POA: Insufficient documentation

## 2018-03-21 DIAGNOSIS — M06032 Rheumatoid arthritis without rheumatoid factor, left wrist: Secondary | ICD-10-CM

## 2018-03-21 DIAGNOSIS — M06031 Rheumatoid arthritis without rheumatoid factor, right wrist: Secondary | ICD-10-CM | POA: Diagnosis not present

## 2018-03-21 DIAGNOSIS — M352 Behcet's disease: Secondary | ICD-10-CM | POA: Diagnosis not present

## 2018-03-21 MED ORDER — GABAPENTIN 100 MG PO CAPS: 100.0000 mg | ORAL_CAPSULE | Freq: Three times a day (TID) | ORAL | 2 refills | Status: DC

## 2018-03-21 MED ORDER — TRAMADOL HCL 50 MG PO TABS: 50.0000 mg | ORAL_TABLET | Freq: Four times a day (QID) | ORAL | 2 refills | Status: DC | PRN

## 2018-03-21 NOTE — Progress Notes (Signed)
Subjective:  Patient ID: Rebekah Martin, female    DOB: 08/07/1975  Age: 43 y.o. MRN: [id number]  CC: Knee Pain   Procedure: None  HPI Rebekah Martin presents for reevaluation.  She is last seen 3 months ago and has been doing well with her fibromyalgia and getting better pain relief with the increase of tramadol to 4 times a day.  She is having less pain during the evening and sleeping better at night and able to be more active she reports.  Based on her narcotic assessment sheet she is deriving good functional lifestyle provement with the medicines and no untoward side effects are noted.  Otherwise she is in her usual state of health but doing limited physical therapy exercises.  The pain when it presents is a gnawing aching type pain diffusely affecting her shoulders knees.   Outpatient Medications Prior to Visit  Medication Sig Dispense Refill  . Adalimumab (HUMIRA PEN) 40 MG/0.8ML PNKT Inject 0.8 mLs into the skin every 14 (fourteen) days. 2 each 11  . Adalimumab 40 MG/0.8ML PNKT Inject into the skin.    Marland Kitchen. albuterol (PROVENTIL HFA;VENTOLIN HFA) 108 (90 Base) MCG/ACT inhaler Inhale into the lungs.    Marland Kitchen. albuterol (PROVENTIL) (2.5 MG/3ML) 0.083% nebulizer solution Inhale into the lungs.    . Calcium Carbonate-Vit D-Min (CALCIUM 600 + MINERALS PO) Take 600 mg by mouth 2 (two) times daily.    . clobetasol cream (TEMOVATE) 0.05 % Apply 1 application topically 2 (two) times daily. 60 g 0  . Clobetasol Prop Emollient Base (CLOBETASOL PROPIONATE E) 0.05 % emollient cream Apply topically.    . docusate sodium (STOOL SOFTENER) 100 MG capsule Take 100 mg by mouth 2 (two) times daily.    . DULoxetine (CYMBALTA) 30 MG capsule Take by mouth.    . meloxicam (MOBIC) 15 MG tablet Take 15 mg by mouth daily as needed for pain.    Marland Kitchen. metoCLOPramide (REGLAN) 10 MG tablet Take 1 tablet (10 mg total) by mouth every 6 (six) hours as needed for nausea. 40 tablet 1  . Multiple Vitamins-Minerals (MULTIVITAMIN ADULT  PO) Take by mouth daily.    . ondansetron (ZOFRAN) 4 MG tablet 4 mg as needed.     . ranitidine (ZANTAC) 75 MG tablet Take 75 mg by mouth 2 (two) times daily.    Marland Kitchen. sulfaSALAzine (AZULFIDINE) 500 MG EC tablet Take 1,500 mg by mouth daily.   3  . SUMAtriptan (IMITREX) 25 MG tablet Take as needed by mouth.     . triamcinolone (KENALOG) 0.1 % paste Apply to affected area(s) 2 times per day as needed    . gabapentin (NEURONTIN) 100 MG capsule Take 1 capsule (100 mg total) by mouth 3 (three) times daily. 90 capsule 2  . traMADol (ULTRAM) 50 MG tablet Take 1 tablet (50 mg total) by mouth every 6 (six) hours as needed. 120 tablet 1   Facility-Administered Medications Prior to Visit  Medication Dose Route Frequency Provider Last Rate Last Dose  . albuterol (PROVENTIL) (2.5 MG/3ML) 0.083% nebulizer solution 2.5 mg  2.5 mg Nebulization Once Particia NearingLane, Rachel Elizabeth, PA-C        Review of Systems CNS: No confusion or sedation Cardiac: No angina or palpitations GI: No abdominal pain or constipation Constitutional: No nausea vomiting fevers or chills  Objective:  BP 131/88   Pulse (!) 149   Temp 97.7 F (36.5 C)   Ht 5\' 3"  (1.6 m)   Wt 105 lb (47.6 kg)  LMP 03/01/2018   SpO2 98%   BMI 18.60 kg/m    BP Readings from Last 3 Encounters:  03/21/18 131/88  01/06/18 122/82  10/03/17 (!) 133/93     Wt Readings from Last 3 Encounters:  03/21/18 105 lb (47.6 kg)  01/06/18 105 lb (47.6 kg)  10/03/17 105 lb (47.6 kg)     Physical Exam Pt is alert and oriented PERRL EOMI HEART IS RRR no murmur or rub LCTA no wheezing or rales MUSCULOSKELETAL reveals good strength with flexion extension at the knees and flexion at the hips.  She is ambulating with a nonantalgic gait and her muscle bulk is at baseline.  Labs  No results found for: HGBA1C No results found for: GLUF, MICROALBUR, LDLCALC,  CREATININE  -------------------------------------------------------------------------------------------------------------------- No results found for: WBC, HGB, HCT, PLT, GLUCOSE, CHOL, TRIG, HDL, LDLDIRECT, LDLCALC, ALT, AST, NA, K, CL, CREATININE, BUN, CO2, TSH, PSA, INR, GLUF, HGBA1C, MICROALBUR  --------------------------------------------------------------------------------------------------------------------- Dg Chest 2 View  Result Date: 02/15/2016 CLINICAL DATA:  Cough for 10 days EXAM: CHEST  2 VIEW COMPARISON:  None. FINDINGS: Cardiomediastinal silhouette is unremarkable. No infiltrate or pleural effusion. No pulmonary edema. Mild hyperinflation. Bony thorax unremarkable. IMPRESSION: No active cardiopulmonary disease.  Mild hyperinflation Electronically Signed   By: Natasha Mead M.D.   On: 02/15/2016 13:37     Assessment & Plan:   Anelle was seen today for knee pain.  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 -     gabapentin (NEURONTIN) 100 MG capsule; Take 1 capsule (100 mg total) by mouth 3 (three) times daily for 30 days.        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 Fibromyalgia syndrome We will defer on any changes in medication management today with refills given for tramadol and Neurontin.  We will have return to clinic in 3 months for reevaluation.  If any changes should occur in the meantime she is instructed to contact us.  She is also instructed to follow-up with her primary care physician team for further baseline medical management.  2. 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 Chronic pain syndrome We have reviewed the [facility] practitioner database information and it is appropriate.  4. Behcet's syndrome (HCC)   5. Neuropathic pain   6. Chronic, continuous use of opioids     ----------------------------------------------------------------------------------------------------------------------  I have changed Zanyia J. Butters's traMADol and gabapentin. I am also having her maintain her meloxicam, Calcium Carbonate-Vit D-Min (CALCIUM 600 + MINERALS PO), docusate sodium, Multiple Vitamins-Minerals (MULTIVITAMIN ADULT PO), sulfaSALAzine, SUMAtriptan,  triamcinolone, Adalimumab, clobetasol cream, Clobetasol Prop Emollient Base, Adalimumab, albuterol, albuterol, DULoxetine, ondansetron, metoCLOPramide, and ranitidine. We will continue to administer albuterol.   Meds ordered this encounter  Medications  . traMADol (ULTRAM) 50 MG tablet    Sig: Take 1 tablet (50 mg total) by mouth every 6 (six) hours as needed for up to 30 days.    Dispense:  120 tablet    Refill:  2  . gabapentin (NEURONTIN) 100 MG capsule    Sig: Take 1 capsule (100 mg total) by mouth 3 (three) times daily for 30 days.    Dispense:  90 capsule    Refill:  2   Patient's Medications  New Prescriptions   No medications on file  Previous Medications   ADALIMUMAB (HUMIRA  PEN) 40 MG/0.8ML PNKT    Inject 0.8 mLs into the skin every 14 (fourteen) days.   ADALIMUMAB 40 MG/0.8ML PNKT    Inject into the skin.   ALBUTEROL (PROVENTIL HFA;VENTOLIN HFA) 108 (90 BASE) MCG/ACT INHALER    Inhale into the lungs.   ALBUTEROL (PROVENTIL) (2.5 MG/3ML) 0.083% NEBULIZER SOLUTION    Inhale into the lungs.   CALCIUM CARBONATE-VIT D-MIN (CALCIUM 600 + MINERALS PO)    Take 600 mg by mouth 2 (two) times daily.   CLOBETASOL CREAM (TEMOVATE) 0.05 %    Apply 1 application topically 2 (two) times daily.   CLOBETASOL PROP EMOLLIENT BASE (CLOBETASOL PROPIONATE E) 0.05 % EMOLLIENT CREAM    Apply topically.   DOCUSATE SODIUM (STOOL SOFTENER) 100 MG CAPSULE    Take 100 mg by mouth 2 (two) times daily.   DULOXETINE (CYMBALTA) 30 MG CAPSULE    Take by mouth.   MELOXICAM (MOBIC) 15 MG TABLET    Take 15 mg by mouth daily as needed for pain.   METOCLOPRAMIDE (REGLAN) 10 MG TABLET    Take 1 tablet (10 mg total) by mouth every 6 (six) hours as needed for nausea.   MULTIPLE VITAMINS-MINERALS (MULTIVITAMIN ADULT PO)    Take by mouth daily.   ONDANSETRON (ZOFRAN) 4 MG TABLET    4 mg as needed.    RANITIDINE (ZANTAC) 75 MG TABLET    Take 75 mg by mouth 2 (two) times daily.   SULFASALAZINE (AZULFIDINE) 500 MG EC TABLET    Take 1,500 mg by mouth daily.    SUMATRIPTAN (IMITREX) 25 MG TABLET    Take as needed by mouth.    TRIAMCINOLONE (KENALOG) 0.1 % PASTE    Apply to affected area(s) 2 times per day as needed  Modified Medications   Modified Medication Previous Medication   GABAPENTIN (NEURONTIN) 100 MG CAPSULE gabapentin (NEURONTIN) 100 MG capsule      Take 1 capsule (100 mg total) by mouth 3 (three) times daily for 30 days.    Take 1 capsule (100 mg total) by mouth 3 (three) times daily.   TRAMADOL (ULTRAM) 50 MG TABLET traMADol (ULTRAM) 50 MG tablet      Take 1 tablet (50 mg total) by mouth every 6 (six) hours as needed for up to 30 days.    Take 1 tablet (50 mg total) by mouth every 6 (six) hours as  needed.  Discontinued Medications   No medications on file   ----------------------------------------------------------------------------------------------------------------------  Follow-up: Return in about 3 months (around 06/20/2018) for evaluation, med refill.    Yevette EdwardsJames G Adams, MD

## 2018-03-21 NOTE — Progress Notes (Signed)
Nursing Pain Medication Assessment:  Safety precautions to be maintained throughout the outpatient stay will include: orient to surroundings, keep bed in low position, maintain call bell within reach at all times, provide assistance with transfer out of bed and ambulation.  Medication Inspection Compliance: Pill count conducted under aseptic conditions, in front of the patient. Neither the pills nor the bottle was removed from the patient's sight at any time. Once count was completed pills were immediately returned to the patient in their original bottle.  Medication: Tramadol (Ultram) Pill/Patch Count: 1 of 120 pills remain Pill/Patch Appearance: Markings consistent with prescribed medication Bottle Appearance: Standard pharmacy container. Clearly labeled. Filled Date: 47 / 13 / 2019 Last Medication intake:  Today

## 2018-04-15 ENCOUNTER — Encounter: Payer: Self-pay | Admitting: Anesthesiology

## 2018-04-17 ENCOUNTER — Telehealth: Payer: Self-pay | Admitting: *Deleted

## 2018-04-17 NOTE — Telephone Encounter (Signed)
Called patient regarding prednisone taper. Called in to [facility] employee pharmacy per patient request and per DR. ADAMS order. Prednisone 10 mg  6 6 5 5 4 4 3 3 2 2 1 1  #[phone]

## 2018-04-30 ENCOUNTER — Encounter: Payer: Self-pay | Admitting: Anesthesiology

## 2018-06-16 MED FILL — sulfaSALAzine 500 MG TBEC: 500 | 30 days supply | Qty: 150 | Fill #0

## 2018-06-16 MED FILL — HYDROXYCHLOROQUINE 200 MG: 200 | 34 days supply | Qty: 34 | Fill #0

## 2018-06-16 MED FILL — MELOXICAM 15 MG TABLET: 15 | 34 days supply | Qty: 34 | Fill #0

## 2018-06-16 MED FILL — DULoxetine HCL 30 MG CPEP: 30 | 34 days supply | Qty: 34 | Fill #0

## 2018-06-16 MED FILL — TRIAMCINOLONE ACETONIDE 0.1: 0.1 | 5 days supply | Qty: 5 | Fill #0

## 2018-06-30 ENCOUNTER — Encounter: Payer: Self-pay | Admitting: Anesthesiology

## 2018-06-30 ENCOUNTER — Ambulatory Visit: Payer: Managed Care, Other (non HMO) | Attending: Anesthesiology | Admitting: Anesthesiology

## 2018-06-30 ENCOUNTER — Other Ambulatory Visit: Payer: Self-pay

## 2018-06-30 DIAGNOSIS — M797 Fibromyalgia: Secondary | ICD-10-CM | POA: Diagnosis not present

## 2018-06-30 DIAGNOSIS — M352 Behcet's disease: Secondary | ICD-10-CM

## 2018-06-30 DIAGNOSIS — M06031 Rheumatoid arthritis without rheumatoid factor, right wrist: Secondary | ICD-10-CM

## 2018-06-30 DIAGNOSIS — M06032 Rheumatoid arthritis without rheumatoid factor, left wrist: Secondary | ICD-10-CM

## 2018-06-30 DIAGNOSIS — M792 Neuralgia and neuritis, unspecified: Secondary | ICD-10-CM

## 2018-06-30 DIAGNOSIS — F1190 Opioid use, unspecified, uncomplicated: Secondary | ICD-10-CM

## 2018-06-30 DIAGNOSIS — G894 Chronic pain syndrome: Secondary | ICD-10-CM

## 2018-06-30 MED ORDER — GABAPENTIN 100 MG PO CAPS: 100.0000 mg | ORAL_CAPSULE | Freq: Three times a day (TID) | ORAL | 2 refills | Status: DC

## 2018-06-30 MED ORDER — TRAMADOL HCL 50 MG PO TABS: 50.0000 mg | ORAL_TABLET | Freq: Four times a day (QID) | ORAL | 2 refills | Status: DC | PRN

## 2018-07-01 NOTE — Progress Notes (Signed)
Virtual Visit via Video Note  I connected with Rebekah Martin on 07/01/18 at  3:15 PM EDT by a video enabled telemedicine application and verified that I am speaking with the correct person using two identifiers.  Location: Patient: Home Provider: Pain clinic   I discussed the limitations of evaluation and management by telemedicine and the availability of in person appointments. The patient expressed understanding and agreed to proceed.  History of Present Illness: I spoke with Rebekah Martin today via video conferencing and discussion of her thoracic and back pain.  The quality characteristic and distribution of this is been stable in nature.  She still taking her medications as prescribed and this combination of medicines has been working well for her.  Based on our discussion no side effects are reported in the quality characteristic and distribution of pain are stable in nature.  She primarily relies on the gabapentin and tramadol to keep her pain under control.  This is mainly in the thoracic back wrists and has not changed since our last encounter.    Observations/Objective  Current Outpatient Medications:  .  Adalimumab (HUMIRA PEN) 40 MG/0.8ML PNKT, Inject 0.8 mLs into the skin every 14 (fourteen) days., Disp: 2 each, Rfl: 11 .  Adalimumab 40 MG/0.8ML PNKT, Inject into the skin., Disp: , Rfl:  .  albuterol (PROVENTIL HFA;VENTOLIN HFA) 108 (90 Base) MCG/ACT inhaler, Inhale into the lungs., Disp: , Rfl:  .  albuterol (PROVENTIL) (2.5 MG/3ML) 0.083% nebulizer solution, Inhale into the lungs., Disp: , Rfl:  .  Calcium Carbonate-Vit D-Min (CALCIUM 600 + MINERALS PO), Take 600 mg by mouth 2 (two) times daily., Disp: , Rfl:  .  clobetasol cream (TEMOVATE) 0.05 %, Apply 1 application topically 2 (two) times daily., Disp: 60 g, Rfl: 0 .  Clobetasol Prop Emollient Base (CLOBETASOL PROPIONATE E) 0.05 % emollient cream, Apply topically., Disp: , Rfl:  .  docusate sodium (STOOL SOFTENER) 100 MG  capsule, Take 100 mg by mouth 2 (two) times daily., Disp: , Rfl:  .  DULoxetine (CYMBALTA) 30 MG capsule, Take by mouth., Disp: , Rfl:  .  gabapentin (NEURONTIN) 100 MG capsule, Take 1 capsule (100 mg total) by mouth 3 (three) times daily for 30 days., Disp: 90 capsule, Rfl: 2 .  meloxicam (MOBIC) 15 MG tablet, Take 15 mg by mouth daily as needed for pain., Disp: , Rfl:  .  metoCLOPramide (REGLAN) 10 MG tablet, Take 1 tablet (10 mg total) by mouth every 6 (six) hours as needed for nausea., Disp: 40 tablet, Rfl: 1 .  Multiple Vitamins-Minerals (MULTIVITAMIN ADULT PO), Take by mouth daily., Disp: , Rfl:  .  ondansetron (ZOFRAN) 4 MG tablet, 4 mg as needed. , Disp: , Rfl:  .  ranitidine (ZANTAC) 75 MG tablet, Take 75 mg by mouth 2 (two) times daily., Disp: , Rfl:  .  sulfaSALAzine (AZULFIDINE) 500 MG EC tablet, Take 1,500 mg by mouth daily. , Disp: , Rfl: 3 .  SUMAtriptan (IMITREX) 25 MG tablet, Take as needed by mouth. , Disp: , Rfl:  .  traMADol (ULTRAM) 50 MG tablet, Take 1 tablet (50 mg total) by mouth every 6 (six) hours as needed for up to 30 days., Disp: 120 tablet, Rfl: 2 .  triamcinolone (KENALOG) 0.1 % paste, Apply to affected area(s) 2 times per day as needed, Disp: , Rfl:   Current Facility-Administered Medications:  .  albuterol (PROVENTIL) (2.5 MG/3ML) 0.083% nebulizer solution 2.5 mg, 2.5 mg, Nebulization, Once, Particia NearingLane, Rachel Elizabeth, PA-C:  Assessment and Plan: 1. Fibromyalgia syndrome   2. Chronic pain syndrome   3. 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 Behcet's syndrome (HCC)   5. Neuropathic pain   6. Chronic, continuous use of opioids   Based on the above discussion and review we will refill her medications today for both tramadol and gabapentin.  I have reviewed the [facility] practitioner database information.  We will schedule her for return to clinic in 3 months for review at that time.  He is to continue follow-up  with her primary care physicians per usual.  She is instructed to contact us should she have any difficulties in the meantime.   Follow Up Instructions: Turn to clinic in 3 months    I discussed the assessment and treatment plan with the patient. The patient was provided an opportunity to ask questions and all were answered. The patient agreed with the plan and demonstrated an understanding of the instructions.   The patient was advised to call back or seek an in-person evaluation if the symptoms worsen or if the condition fails to improve as anticipated.  I provided 30 minutes of non-face-to-face time during this encounter.   [name], MD

## 2018-07-15 MED FILL — HYDROXYCHLOROQUINE 200 MG: 200 | 30 days supply | Qty: 30 | Fill #1

## 2018-07-15 MED FILL — MELOXICAM 15 MG TABLET: 15 | 34 days supply | Qty: 34 | Fill #1

## 2018-07-15 MED FILL — DULoxetine HCL 30 MG CPEP: 30 | 30 days supply | Qty: 30 | Fill #1

## 2018-07-22 IMAGING — DX DG CHEST 2V
2 series · 2 of 2 positions shown · non-contrast
Comparison: None.

CLINICAL DATA: Cough for 10 days

EXAM:
CHEST  2 VIEW

[chest pa]
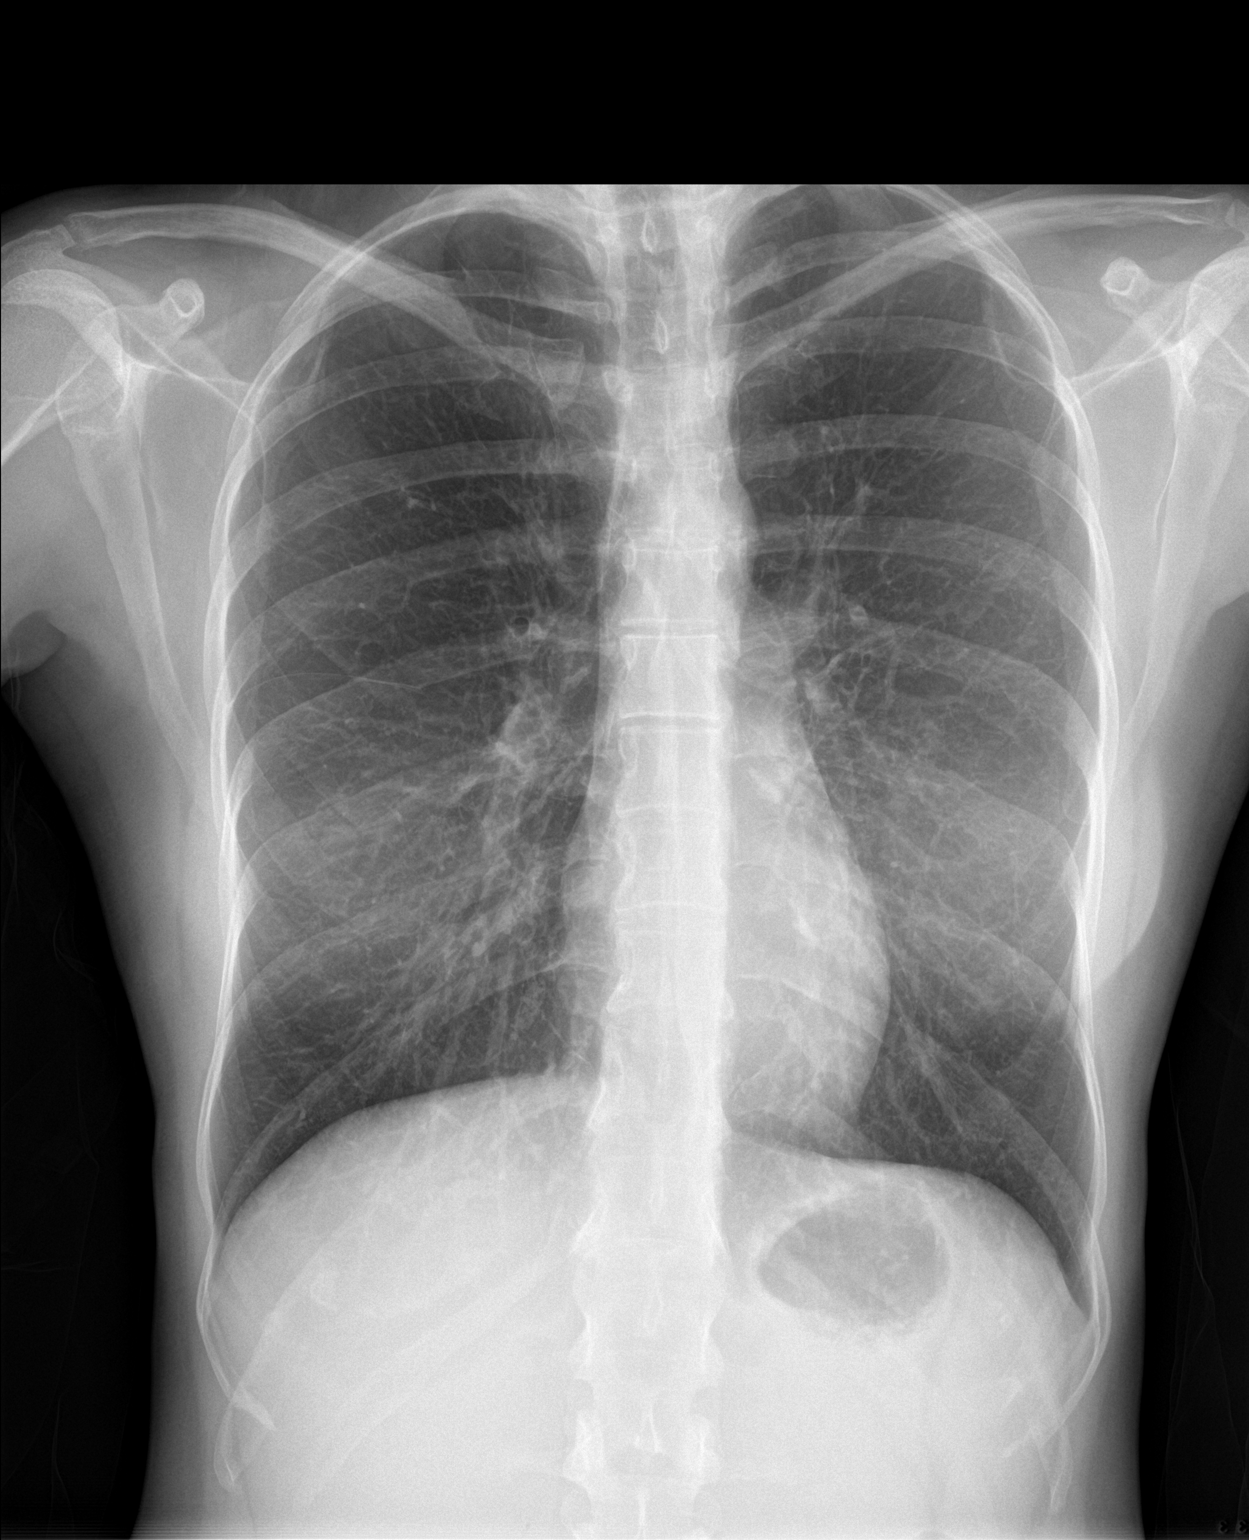

[chest lat]
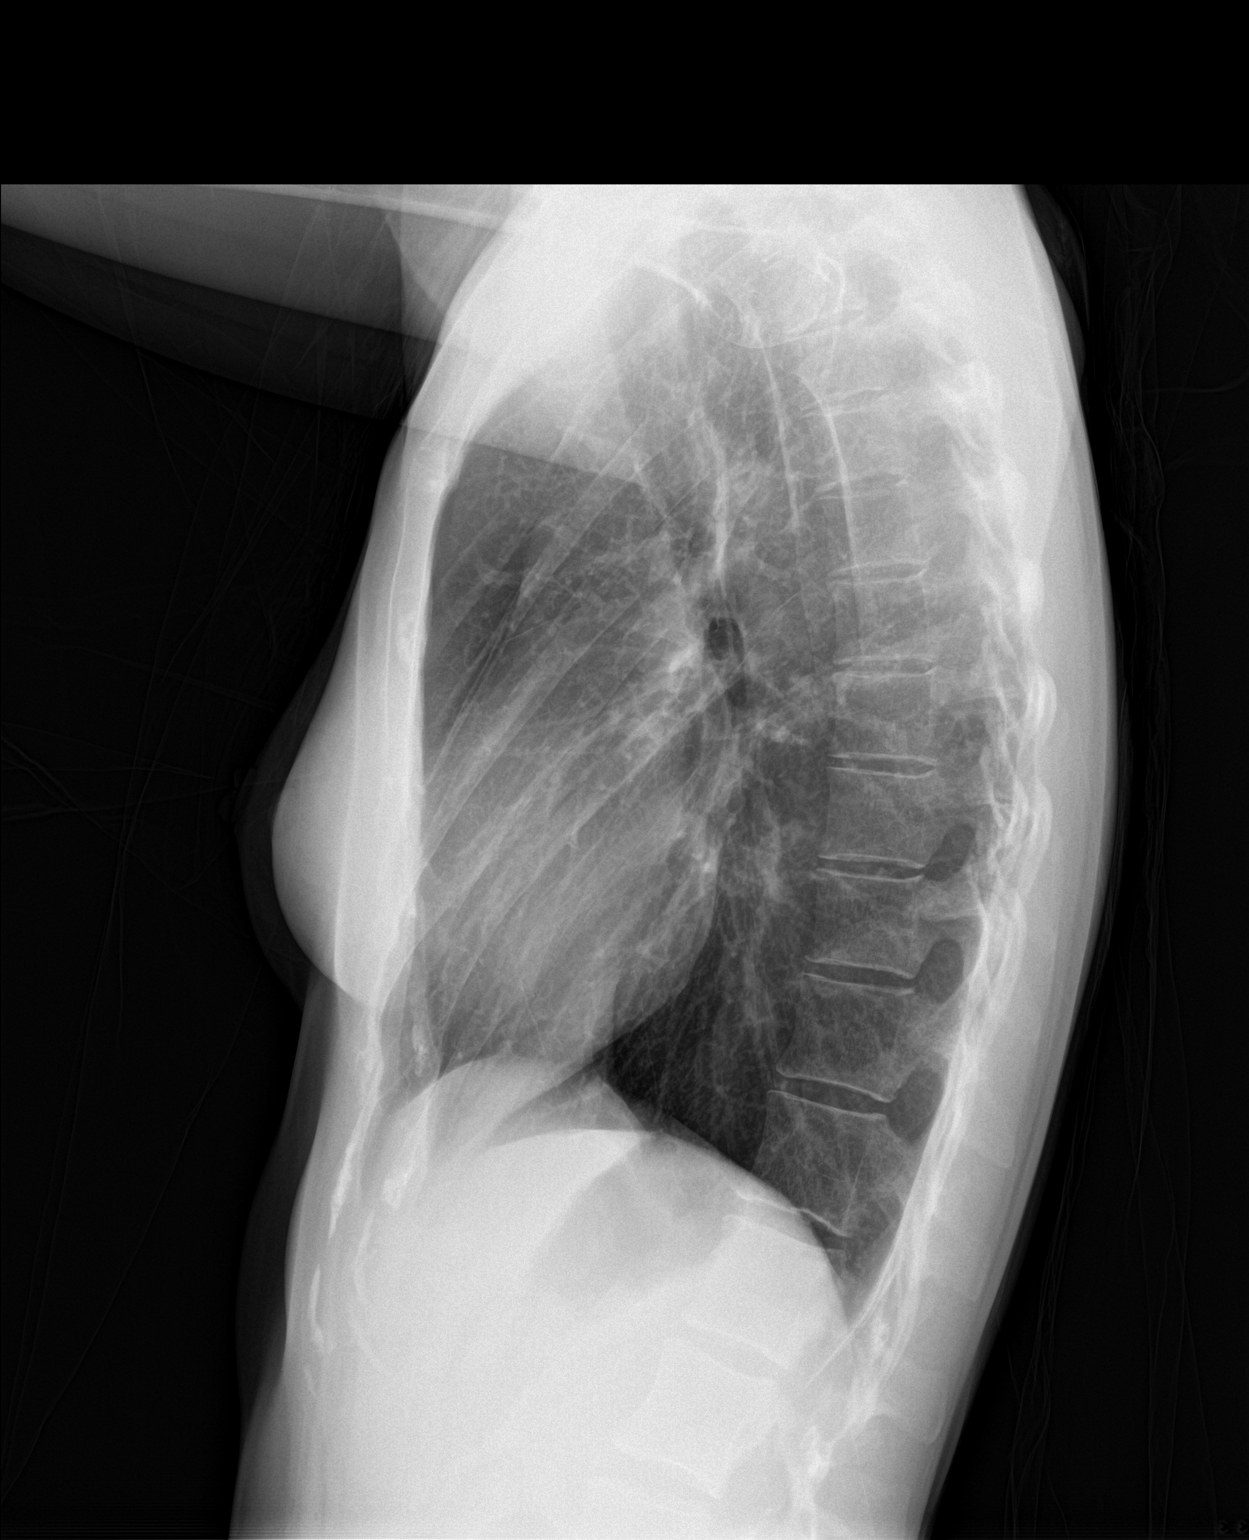

[2 of 2 positions shown; findings below may reference images not displayed]

FINDINGS: Cardiomediastinal silhouette is unremarkable. No infiltrate or
pleural effusion. No pulmonary edema. Mild hyperinflation. Bony
thorax unremarkable.
IMPRESSION: No active cardiopulmonary disease.  Mild hyperinflation

## 2018-07-24 ENCOUNTER — Other Ambulatory Visit: Payer: Self-pay | Admitting: Physician Assistant

## 2018-07-24 DIAGNOSIS — Z1231 Encounter for screening mammogram for malignant neoplasm of breast: Secondary | ICD-10-CM

## 2018-08-25 ENCOUNTER — Other Ambulatory Visit: Payer: Self-pay

## 2018-08-25 ENCOUNTER — Encounter: Payer: Self-pay | Admitting: Anesthesiology

## 2018-08-25 ENCOUNTER — Ambulatory Visit: Payer: Managed Care, Other (non HMO) | Attending: Anesthesiology | Admitting: Anesthesiology

## 2018-08-25 DIAGNOSIS — M069 Rheumatoid arthritis, unspecified: Secondary | ICD-10-CM

## 2018-08-25 DIAGNOSIS — M797 Fibromyalgia: Secondary | ICD-10-CM

## 2018-08-25 DIAGNOSIS — M352 Behcet's disease: Secondary | ICD-10-CM

## 2018-08-25 DIAGNOSIS — G894 Chronic pain syndrome: Secondary | ICD-10-CM

## 2018-08-25 DIAGNOSIS — F1190 Opioid use, unspecified, uncomplicated: Secondary | ICD-10-CM

## 2018-08-25 HISTORY — DX: Opioid use, unspecified, uncomplicated: F11.90

## 2018-08-25 HISTORY — DX: Behcet's disease: M35.2

## 2018-08-25 HISTORY — DX: Chronic pain syndrome: G89.4

## 2018-09-30 ENCOUNTER — Other Ambulatory Visit: Payer: Self-pay

## 2018-09-30 ENCOUNTER — Ambulatory Visit: Payer: 59 | Attending: Anesthesiology | Admitting: Anesthesiology

## 2018-09-30 ENCOUNTER — Encounter: Payer: Self-pay | Admitting: Anesthesiology

## 2018-09-30 DIAGNOSIS — M06032 Rheumatoid arthritis without rheumatoid factor, left wrist: Secondary | ICD-10-CM

## 2018-09-30 DIAGNOSIS — M06031 Rheumatoid arthritis without rheumatoid factor, right wrist: Secondary | ICD-10-CM

## 2018-09-30 DIAGNOSIS — M352 Behcet's disease: Secondary | ICD-10-CM | POA: Diagnosis not present

## 2018-09-30 DIAGNOSIS — G894 Chronic pain syndrome: Secondary | ICD-10-CM | POA: Diagnosis not present

## 2018-09-30 DIAGNOSIS — F1190 Opioid use, unspecified, uncomplicated: Secondary | ICD-10-CM

## 2018-09-30 DIAGNOSIS — M792 Neuralgia and neuritis, unspecified: Secondary | ICD-10-CM

## 2018-09-30 DIAGNOSIS — M797 Fibromyalgia: Secondary | ICD-10-CM | POA: Diagnosis not present

## 2018-09-30 MED ORDER — GABAPENTIN 100 MG PO CAPS: 100.0000 mg | ORAL_CAPSULE | Freq: Three times a day (TID) | ORAL | 2 refills | Status: DC

## 2018-09-30 MED ORDER — TRAMADOL HCL 50 MG PO TABS: 50.0000 mg | ORAL_TABLET | Freq: Four times a day (QID) | ORAL | 2 refills | Status: DC | PRN

## 2018-09-30 NOTE — Progress Notes (Signed)
Virtual Visit via Video Note  I connected with Rebekah Martin on 09/30/18 at  2:30 PM EDT by a video enabled telemedicine application and verified that I am speaking with the correct person using two identifiers.  Location: Patient: Home Provider: Pain control center   I discussed the limitations of evaluation and management by telemedicine and the availability of in person appointments. The patient expressed understanding and agreed to proceed.  History of Present Illness: I spoke with Rebekah Martin today via video virtual conferencing and she has been doing about the same.  The quality characteristic distribution of pain have been stable in nature primarily affecting the hands legs knees and ankles.  She still taking her tramadol as prescribed and this is working well for her.  No side effects are reported and she is getting good relief with the medications.  She is also taking gabapentin 3 times a day at 100 mg strength and this is well-tolerated.  She has been stable otherwise with no reported changes and feels that she is doing reasonably well.    Observations/Objective: Current Outpatient Medications:  .  Adalimumab (HUMIRA PEN) 40 MG/0.8ML PNKT, Inject 0.8 mLs into the skin every 14 (fourteen) days., Disp: 2 each, Rfl: 11 .  Adalimumab 40 MG/0.8ML PNKT, Inject into the skin., Disp: , Rfl:  .  albuterol (PROVENTIL HFA;VENTOLIN HFA) 108 (90 Base) MCG/ACT inhaler, Inhale into the lungs., Disp: , Rfl:  .  albuterol (PROVENTIL) (2.5 MG/3ML) 0.083% nebulizer solution, Inhale into the lungs., Disp: , Rfl:  .  Calcium Carbonate-Vit D-Min (CALCIUM 600 + MINERALS PO), Take 600 mg by mouth 2 (two) times daily., Disp: , Rfl:  .  clobetasol cream (TEMOVATE) 0.05 %, Apply 1 application topically 2 (two) times daily., Disp: 60 g, Rfl: 0 .  Clobetasol Prop Emollient Base (CLOBETASOL PROPIONATE E) 0.05 % emollient cream, Apply topically., Disp: , Rfl:  .  docusate sodium (STOOL SOFTENER) 100 MG capsule, Take 100 mg  by mouth 2 (two) times daily., Disp: , Rfl:  .  DULoxetine (CYMBALTA) 30 MG capsule, Take by mouth., Disp: , Rfl:  .  gabapentin (NEURONTIN) 100 MG capsule, Take 1 capsule (100 mg total) by mouth 3 (three) times daily., Disp: 90 capsule, Rfl: 2 .  meloxicam (MOBIC) 15 MG tablet, Take 15 mg by mouth daily as needed for pain., Disp: , Rfl:  .  metoCLOPramide (REGLAN) 10 MG tablet, Take 1 tablet (10 mg total) by mouth every 6 (six) hours as needed for nausea., Disp: 40 tablet, Rfl: 1 .  Multiple Vitamins-Minerals (MULTIVITAMIN ADULT PO), Take by mouth daily., Disp: , Rfl:  .  ondansetron (ZOFRAN) 4 MG tablet, 4 mg as needed. , Disp: , Rfl:  .  ranitidine (ZANTAC) 75 MG tablet, Take 75 mg by mouth 2 (two) times daily., Disp: , Rfl:  .  sulfaSALAzine (AZULFIDINE) 500 MG EC tablet, Take 1,500 mg by mouth daily. , Disp: , Rfl: 3 .  SUMAtriptan (IMITREX) 25 MG tablet, Take as needed by mouth. , Disp: , Rfl:  .  traMADol (ULTRAM) 50 MG tablet, Take 1 tablet (50 mg total) by mouth every 6 (six) hours as needed., Disp: 120 tablet, Rfl: 2 .  triamcinolone (KENALOG) 0.1 % paste, Apply to affected area(s) 2 times per day as needed, Disp: , Rfl:   Current Facility-Administered Medications:  .  albuterol (PROVENTIL) (2.5 MG/3ML) 0.083% nebulizer solution 2.5 mg, 2.5 mg, Nebulization, Once, Particia Nearing, PA-C   Assessment and Plan: 1. Fibromyalgia syndrome  2. Chronic pain syndrome   3. 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 Behcet's syndrome ([place])   5. Neuropathic pain   6. Chronic, continuous use of opioids   Based on my review today and upon review of the [facility] practitioner database information we will go ahead and refill her tramadol and gabapentin.  I want her to continue with her stretching strengthening exercises as tolerated with return to clinic scheduled in 3 months.  She is to continue follow-up with her primary care physicians for  baseline medical care.   Follow Up Instructions:    I discussed the assessment and treatment plan with the patient. The patient was provided an opportunity to ask questions and all were answered. The patient agreed with the plan and demonstrated an understanding of the instructions.   The patient was advised to call back or seek an in-person evaluation if the symptoms worsen or if the condition fails to improve as anticipated.  I provided 30 minutes of non-face-to-face time during this encounter.   Molli Barrows, MD

## 2018-12-25 ENCOUNTER — Encounter: Payer: Self-pay | Admitting: Anesthesiology

## 2018-12-25 ENCOUNTER — Other Ambulatory Visit: Payer: Self-pay

## 2018-12-25 ENCOUNTER — Ambulatory Visit: Payer: 59 | Attending: Anesthesiology | Admitting: Anesthesiology

## 2018-12-25 DIAGNOSIS — F1190 Opioid use, unspecified, uncomplicated: Secondary | ICD-10-CM

## 2018-12-25 DIAGNOSIS — G8929 Other chronic pain: Secondary | ICD-10-CM | POA: Insufficient documentation

## 2018-12-25 DIAGNOSIS — M25532 Pain in left wrist: Secondary | ICD-10-CM

## 2018-12-25 DIAGNOSIS — M25531 Pain in right wrist: Secondary | ICD-10-CM

## 2018-12-25 DIAGNOSIS — M25569 Pain in unspecified knee: Secondary | ICD-10-CM

## 2018-12-25 DIAGNOSIS — M06031 Rheumatoid arthritis without rheumatoid factor, right wrist: Secondary | ICD-10-CM

## 2018-12-25 DIAGNOSIS — M25561 Pain in right knee: Secondary | ICD-10-CM

## 2018-12-25 DIAGNOSIS — M792 Neuralgia and neuritis, unspecified: Secondary | ICD-10-CM

## 2018-12-25 DIAGNOSIS — M25539 Pain in unspecified wrist: Secondary | ICD-10-CM

## 2018-12-25 DIAGNOSIS — M352 Behcet's disease: Secondary | ICD-10-CM

## 2018-12-25 DIAGNOSIS — M797 Fibromyalgia: Secondary | ICD-10-CM

## 2018-12-25 DIAGNOSIS — M06032 Rheumatoid arthritis without rheumatoid factor, left wrist: Secondary | ICD-10-CM

## 2018-12-25 DIAGNOSIS — G894 Chronic pain syndrome: Secondary | ICD-10-CM

## 2018-12-25 DIAGNOSIS — M25562 Pain in left knee: Secondary | ICD-10-CM

## 2018-12-25 HISTORY — DX: Other chronic pain: G89.29

## 2018-12-25 HISTORY — DX: Pain in unspecified wrist: M25.539

## 2018-12-25 HISTORY — DX: Pain in unspecified knee: M25.569

## 2018-12-25 MED ORDER — TRAMADOL HCL 50 MG PO TABS: 50.0000 mg | ORAL_TABLET | Freq: Four times a day (QID) | ORAL | 2 refills | Status: DC | PRN

## 2018-12-25 MED ORDER — GABAPENTIN 100 MG PO CAPS: 100.0000 mg | ORAL_CAPSULE | Freq: Three times a day (TID) | ORAL | 2 refills | Status: DC

## 2019-02-26 ENCOUNTER — Ambulatory Visit: Payer: Managed Care, Other (non HMO) | Attending: Internal Medicine

## 2019-02-26 DIAGNOSIS — Z20822 Contact with and (suspected) exposure to covid-19: Secondary | ICD-10-CM

## 2019-03-04 LAB — NOVEL CORONAVIRUS, NAA

## 2019-04-01 ENCOUNTER — Ambulatory Visit: Payer: Managed Care, Other (non HMO) | Attending: Anesthesiology | Admitting: Anesthesiology

## 2019-04-01 ENCOUNTER — Encounter: Payer: Self-pay | Admitting: Anesthesiology

## 2019-04-01 ENCOUNTER — Other Ambulatory Visit: Payer: Self-pay

## 2019-04-01 DIAGNOSIS — M25561 Pain in right knee: Secondary | ICD-10-CM

## 2019-04-01 DIAGNOSIS — F1190 Opioid use, unspecified, uncomplicated: Secondary | ICD-10-CM | POA: Diagnosis not present

## 2019-04-01 DIAGNOSIS — M25562 Pain in left knee: Secondary | ICD-10-CM

## 2019-04-01 DIAGNOSIS — M792 Neuralgia and neuritis, unspecified: Secondary | ICD-10-CM | POA: Diagnosis not present

## 2019-04-01 DIAGNOSIS — M352 Behcet's disease: Secondary | ICD-10-CM | POA: Diagnosis not present

## 2019-04-01 DIAGNOSIS — M797 Fibromyalgia: Secondary | ICD-10-CM | POA: Diagnosis not present

## 2019-04-01 DIAGNOSIS — M06032 Rheumatoid arthritis without rheumatoid factor, left wrist: Secondary | ICD-10-CM

## 2019-04-01 DIAGNOSIS — M25532 Pain in left wrist: Secondary | ICD-10-CM

## 2019-04-01 DIAGNOSIS — M06031 Rheumatoid arthritis without rheumatoid factor, right wrist: Secondary | ICD-10-CM

## 2019-04-01 DIAGNOSIS — M25531 Pain in right wrist: Secondary | ICD-10-CM

## 2019-04-01 DIAGNOSIS — G8929 Other chronic pain: Secondary | ICD-10-CM

## 2019-04-01 MED ORDER — TRAMADOL HCL 50 MG PO TABS: 50.0000 mg | ORAL_TABLET | Freq: Four times a day (QID) | ORAL | 2 refills | Status: DC | PRN

## 2019-04-01 MED ORDER — GABAPENTIN 100 MG PO CAPS: 100.0000 mg | ORAL_CAPSULE | Freq: Three times a day (TID) | ORAL | 2 refills | Status: DC

## 2019-04-02 NOTE — Progress Notes (Signed)
Virtual Visit via Video Note  I connected with Rebekah Martin on 04/02/19 at 12:45 PM EST by a video enabled telemedicine application and verified that I am speaking with the correct person using two identifiers.  Location: Patient: Home Provider: Pain control center   I discussed the limitations of evaluation and management by telemedicine and the availability of in person appointments. The patient expressed understanding and agreed to proceed.  History of Present Illnes I spoke with Rebekah Martin today via video conferencing for her virtual return to clinic.  She reports that she still having pain of the same baseline pain as previously documented.  No significant change in the characteristic quality or distribution are noted.  The tramadol generally keeps her pain under decent control and she is taking is approximately 3-4 times a day on average.  She also takes gabapentin for some of the neuralgic pain that she experiences and this also is working for her.  Otherwise she is in her usual state of health no changes in the quality characteristic or strength in the upper or lower extremities.  She reports that she is otherwise satisfactory.    Observations/Objective:  Current Outpatient Medications:  .  Adalimumab (HUMIRA PEN) 40 MG/0.8ML PNKT, Inject 0.8 mLs into the skin every 14 (fourteen) days., Disp: 2 each, Rfl: 11 .  Adalimumab 40 MG/0.8ML PNKT, Inject into the skin., Disp: , Rfl:  .  albuterol (PROVENTIL HFA;VENTOLIN HFA) 108 (90 Base) MCG/ACT inhaler, Inhale into the lungs., Disp: , Rfl:  .  albuterol (PROVENTIL) (2.5 MG/3ML) 0.083% nebulizer solution, Inhale into the lungs., Disp: , Rfl:  .  Calcium Carbonate-Vit D-Min (CALCIUM 600 + MINERALS PO), Take 600 mg by mouth 2 (two) times daily., Disp: , Rfl:  .  clobetasol cream (TEMOVATE) 0.05 %, Apply 1 application topically 2 (two) times daily., Disp: 60 g, Rfl: 0 .  Clobetasol Prop Emollient Base (CLOBETASOL PROPIONATE E) 0.05 % emollient  cream, Apply topically., Disp: , Rfl:  .  docusate sodium (STOOL SOFTENER) 100 MG capsule, Take 100 mg by mouth 2 (two) times daily., Disp: , Rfl:  .  DULoxetine (CYMBALTA) 30 MG capsule, Take by mouth., Disp: , Rfl:  .  gabapentin (NEURONTIN) 100 MG capsule, Take 1 capsule (100 mg total) by mouth 3 (three) times daily., Disp: 90 capsule, Rfl: 2 .  meloxicam (MOBIC) 15 MG tablet, Take 15 mg by mouth daily as needed for pain., Disp: , Rfl:  .  metoCLOPramide (REGLAN) 10 MG tablet, Take 1 tablet (10 mg total) by mouth every 6 (six) hours as needed for nausea., Disp: 40 tablet, Rfl: 1 .  Multiple Vitamins-Minerals (MULTIVITAMIN ADULT PO), Take by mouth daily., Disp: , Rfl:  .  ondansetron (ZOFRAN) 4 MG tablet, 4 mg as needed. , Disp: , Rfl:  .  ranitidine (ZANTAC) 75 MG tablet, Take 75 mg by mouth 2 (two) times daily., Disp: , Rfl:  .  sulfaSALAzine (AZULFIDINE) 500 MG EC tablet, Take 1,500 mg by mouth daily. , Disp: , Rfl: 3 .  SUMAtriptan (IMITREX) 25 MG tablet, Take as needed by mouth. , Disp: , Rfl:  .  traMADol (ULTRAM) 50 MG tablet, Take 1 tablet (50 mg total) by mouth every 6 (six) hours as needed., Disp: 120 tablet, Rfl: 2 .  triamcinolone (KENALOG) 0.1 % paste, Apply to affected area(s) 2 times per day as needed, Disp: , Rfl:   Current Facility-Administered Medications:  .  albuterol (PROVENTIL) (2.5 MG/3ML) 0.083% nebulizer solution 2.5 mg, 2.5  mg, Nebulization, Once, Volney American, PA-C  Assessment and Plan: 1. 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 Neuropathic pain   4. Chronic, continuous use of opioids   5. 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 Pain in both wrists   7. Chronic pain of both knees   Based on our discussion today upon review of the [facility] practitioner database information when to go ahead and refill her tramadol.  Also refill her Neurontin.  She is to take these as scheduled and will  plan on return to clinic within 2 to 3 months.  If she has any changes in her pain symptoms she is instructed to contact the pain control center in the meantime.  Also want her to continue follow-up with her primary care physicians.    Follow Up Instructions:    I discussed the assessment and treatment plan with the patient. The patient was provided an opportunity to ask questions and all were answered. The patient agreed with the plan and demonstrated an understanding of the instructions.   The patient was advised to call back or seek an in-person evaluation if the symptoms worsen or if the condition fails to improve as anticipated.  I provided 30 minutes of non-face-to-face time during this encounter.   Molli Barrows, MD

## 2019-06-16 ENCOUNTER — Other Ambulatory Visit: Payer: Self-pay | Admitting: Registered Nurse

## 2019-06-23 ENCOUNTER — Other Ambulatory Visit: Payer: Self-pay | Admitting: Registered Nurse

## 2019-07-02 ENCOUNTER — Other Ambulatory Visit: Payer: Self-pay

## 2019-07-02 ENCOUNTER — Encounter: Payer: Self-pay | Admitting: Anesthesiology

## 2019-07-02 ENCOUNTER — Ambulatory Visit: Payer: 59 | Attending: Anesthesiology | Admitting: Anesthesiology

## 2019-07-02 DIAGNOSIS — M797 Fibromyalgia: Secondary | ICD-10-CM

## 2019-07-02 DIAGNOSIS — M06032 Rheumatoid arthritis without rheumatoid factor, left wrist: Secondary | ICD-10-CM

## 2019-07-02 DIAGNOSIS — M792 Neuralgia and neuritis, unspecified: Secondary | ICD-10-CM | POA: Diagnosis not present

## 2019-07-02 DIAGNOSIS — G894 Chronic pain syndrome: Secondary | ICD-10-CM

## 2019-07-02 DIAGNOSIS — G8929 Other chronic pain: Secondary | ICD-10-CM

## 2019-07-02 DIAGNOSIS — M25532 Pain in left wrist: Secondary | ICD-10-CM

## 2019-07-02 DIAGNOSIS — M25531 Pain in right wrist: Secondary | ICD-10-CM

## 2019-07-02 DIAGNOSIS — M352 Behcet's disease: Secondary | ICD-10-CM | POA: Diagnosis not present

## 2019-07-02 DIAGNOSIS — M06031 Rheumatoid arthritis without rheumatoid factor, right wrist: Secondary | ICD-10-CM

## 2019-07-02 DIAGNOSIS — M25561 Pain in right knee: Secondary | ICD-10-CM

## 2019-07-02 DIAGNOSIS — M25562 Pain in left knee: Secondary | ICD-10-CM

## 2019-07-02 DIAGNOSIS — F1190 Opioid use, unspecified, uncomplicated: Secondary | ICD-10-CM

## 2019-07-02 MED ORDER — TRAMADOL HCL 50 MG PO TABS: 50.0000 mg | ORAL_TABLET | Freq: Four times a day (QID) | ORAL | 2 refills | Status: DC | PRN

## 2019-07-03 ENCOUNTER — Other Ambulatory Visit: Payer: Self-pay | Admitting: Anesthesiology

## 2019-07-06 ENCOUNTER — Encounter: Payer: Self-pay | Admitting: Anesthesiology

## 2019-07-06 MED ORDER — GABAPENTIN 100 MG PO CAPS: 100.0000 mg | ORAL_CAPSULE | Freq: Three times a day (TID) | ORAL | 2 refills | Status: DC

## 2019-07-06 NOTE — Progress Notes (Signed)
Virtual Visit via Telephone Note  I connected with Rebekah Martin on 07/06/19 at  2:15 PM EDT by telephone and verified that I am speaking with the correct person using two identifiers.  Location: Patient: Home Provider: Pain control center I spoke with Rebekah Martin on telephone    I discussed the limitations, risks, security and privacy concerns of performing an evaluation and management service by telephone and the availability of in person appointments. I also discussed with the patient that there may be a patient responsible charge related to this service. The patient expressed understanding and agreed to proceed.   History of Present Illness: I spoke with Rebekah Martin via telephone for her virtual appointment today.  She was unable to do the video portion of this.  She reports that she has been doing reasonably well with her thoracic back pain.  The quality characteristic and distribution has been stable in nature.  It is mainly a burning aching pain that is present throughout much of the day.  Unfortunately this failed more conservative therapy and physical therapy has been limited as to effectiveness.  She continues to take her tramadol as prescribed and this works well for her.  Furthermore her gabapentin has worked well for her.  She takes this 3 times a day.  The spasming in the mid thoracic area has been stable.  Her upper extremity strength is been doing well.  Observations/Objective:   Current Outpatient Medications:  .  Adalimumab (HUMIRA PEN) 40 MG/0.8ML PNKT, Inject 0.8 mLs into the skin every 14 (fourteen) days., Disp: 2 each, Rfl: 11 .  Adalimumab 40 MG/0.8ML PNKT, Inject into the skin., Disp: , Rfl:  .  albuterol (PROVENTIL HFA;VENTOLIN HFA) 108 (90 Base) MCG/ACT inhaler, Inhale into the lungs., Disp: , Rfl:  .  albuterol (PROVENTIL) (2.5 MG/3ML) 0.083% nebulizer solution, Inhale into the lungs., Disp: , Rfl:  .  Calcium Carbonate-Vit D-Min (CALCIUM 600 + MINERALS PO), Take 600 mg by mouth 2  (two) times daily., Disp: , Rfl:  .  clobetasol cream (TEMOVATE) 0.05 %, Apply 1 application topically 2 (two) times daily., Disp: 60 g, Rfl: 0 .  Clobetasol Prop Emollient Base (CLOBETASOL PROPIONATE E) 0.05 % emollient cream, Apply topically., Disp: , Rfl:  .  docusate sodium (STOOL SOFTENER) 100 MG capsule, Take 100 mg by mouth 2 (two) times daily., Disp: , Rfl:  .  DULoxetine (CYMBALTA) 30 MG capsule, Take by mouth., Disp: , Rfl:  .  gabapentin (NEURONTIN) 100 MG capsule, Take 1 capsule (100 mg total) by mouth 3 (three) times daily., Disp: 90 capsule, Rfl: 2 .  meloxicam (MOBIC) 15 MG tablet, Take 15 mg by mouth daily as needed for pain., Disp: , Rfl:  .  metoCLOPramide (REGLAN) 10 MG tablet, Take 1 tablet (10 mg total) by mouth every 6 (six) hours as needed for nausea., Disp: 40 tablet, Rfl: 1 .  Multiple Vitamins-Minerals (MULTIVITAMIN ADULT PO), Take by mouth daily., Disp: , Rfl:  .  ondansetron (ZOFRAN) 4 MG tablet, 4 mg as needed. , Disp: , Rfl:  .  ranitidine (ZANTAC) 75 MG tablet, Take 75 mg by mouth 2 (two) times daily., Disp: , Rfl:  .  sulfaSALAzine (AZULFIDINE) 500 MG EC tablet, Take 1,500 mg by mouth daily. , Disp: , Rfl: 3 .  SUMAtriptan (IMITREX) 25 MG tablet, Take as needed by mouth. , Disp: , Rfl:  .  traMADol (ULTRAM) 50 MG tablet, Take 1 tablet (50 mg total) by mouth every 6 (six) hours as  needed., Disp: 120 tablet, Rfl: 2 .  triamcinolone (KENALOG) 0.1 % paste, Apply to affected area(s) 2 times per day as needed, Disp: , Rfl:   Current Facility-Administered Medications:  .  albuterol (PROVENTIL) (2.5 MG/3ML) 0.083% nebulizer solution 2.5 mg, 2.5 mg, Nebulization, Once, Rebekah American, PA-CAssessment and Plan:   Follow Up Instructions: 1. 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 Neuropathic pain   4. Chronic, continuous use of opioids   5. 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 Pain in both wrists    7. Chronic pain of both knees   8. Chronic pain syndrome   Based on our discussion today I am going to keep her on her current regimen.  We will refill her tramadol for the next several months and avoid also continue her gabapentin that seems to help with sleep and neuropathic type pain.  Otherwise continue with physical therapy exercises with return to clinic in 2 months to 3 months for reevaluation.  I encouraged her to continue follow-up with her primary care physicians for baseline medical care.   I discussed the assessment and treatment plan with the patient. The patient was provided an opportunity to ask questions and all were answered. The patient agreed with the plan and demonstrated an understanding of the instructions.   The patient was advised to call back or seek an in-person evaluation if the symptoms worsen or if the condition fails to improve as anticipated.  I provided 25 minutes of non-face-to-face time during this encounter.   Molli Barrows, MD

## 2019-10-13 ENCOUNTER — Ambulatory Visit: Payer: 59 | Attending: Anesthesiology | Admitting: Anesthesiology

## 2019-10-13 ENCOUNTER — Other Ambulatory Visit: Payer: Self-pay

## 2019-10-13 ENCOUNTER — Other Ambulatory Visit: Payer: Self-pay | Admitting: Anesthesiology

## 2019-10-13 ENCOUNTER — Encounter: Payer: Self-pay | Admitting: Anesthesiology

## 2019-10-13 DIAGNOSIS — M25562 Pain in left knee: Secondary | ICD-10-CM

## 2019-10-13 DIAGNOSIS — M06032 Rheumatoid arthritis without rheumatoid factor, left wrist: Secondary | ICD-10-CM

## 2019-10-13 DIAGNOSIS — M25532 Pain in left wrist: Secondary | ICD-10-CM

## 2019-10-13 DIAGNOSIS — G894 Chronic pain syndrome: Secondary | ICD-10-CM

## 2019-10-13 DIAGNOSIS — F1190 Opioid use, unspecified, uncomplicated: Secondary | ICD-10-CM | POA: Diagnosis not present

## 2019-10-13 DIAGNOSIS — M797 Fibromyalgia: Secondary | ICD-10-CM

## 2019-10-13 DIAGNOSIS — M25531 Pain in right wrist: Secondary | ICD-10-CM

## 2019-10-13 DIAGNOSIS — M792 Neuralgia and neuritis, unspecified: Secondary | ICD-10-CM | POA: Diagnosis not present

## 2019-10-13 DIAGNOSIS — M06031 Rheumatoid arthritis without rheumatoid factor, right wrist: Secondary | ICD-10-CM

## 2019-10-13 DIAGNOSIS — M352 Behcet's disease: Secondary | ICD-10-CM

## 2019-10-13 DIAGNOSIS — G8929 Other chronic pain: Secondary | ICD-10-CM

## 2019-10-13 DIAGNOSIS — M25561 Pain in right knee: Secondary | ICD-10-CM

## 2019-10-13 MED ORDER — GABAPENTIN 100 MG PO CAPS: 100.0000 mg | ORAL_CAPSULE | Freq: Three times a day (TID) | ORAL | 2 refills | Status: DC

## 2019-10-13 MED ORDER — TRAMADOL HCL 50 MG PO TABS: 50.0000 mg | ORAL_TABLET | Freq: Four times a day (QID) | ORAL | 2 refills | Status: DC | PRN

## 2019-10-14 NOTE — Progress Notes (Signed)
Virtual Visit via Telephone Note  I connected with [name] on 10/14/19 at  1:00 PM EDT by telephone and verified that I am speaking with the correct person using two identifiers.  Location: Patient: Home Provider: Pain control center   I discussed the limitations, risks, security and privacy concerns of performing an evaluation and management service by telephone and the availability of in person appointments. I also discussed with the patient that there may be a patient responsible charge related to this service. The patient expressed understanding and agreed to proceed.   History of Present Illness: Rebekah Martin presents for reevaluation.  She is unable to do the video portion of the virtual conference.  She reports that she is still having some considerable low back pain but of similar quality to what she has experienced in the past.  The quality characteristic and nature of the pain unchanged.  Otherwise she is in her usual state of health.  She continues to take her Neurontin 3 times a day and her tramadol as prescribed.  These continue to give her good relief.  She does her physical therapy exercises and these seem to help.  Otherwise she is in her usual state of health at this point.  She mentions that her wrist pain and diffuse body pain from her rheumatoid arthritis is otherwise unchanged.    Observations/Objective:  Current Outpatient Medications:  .  Adalimumab (HUMIRA PEN) 40 MG/0.8ML PNKT, Inject 0.8 mLs into the skin every 14 (fourteen) days., Disp: 2 each, Rfl: 11 .  Adalimumab 40 MG/0.8ML PNKT, Inject into the skin., Disp: , Rfl:  .  albuterol (PROVENTIL HFA;VENTOLIN HFA) 108 (90 Base) MCG/ACT inhaler, Inhale into the lungs., Disp: , Rfl:  .  albuterol (PROVENTIL) (2.5 MG/3ML) 0.083% nebulizer solution, Inhale into the lungs., Disp: , Rfl:  .  Calcium Carbonate-Vit D-Min (CALCIUM 600 + MINERALS PO), Take 600 mg by mouth 2 (two) times daily., Disp: , Rfl:  .  clobetasol cream  (TEMOVATE) 0.05 %, Apply 1 application topically 2 (two) times daily., Disp: 60 g, Rfl: 0 .  Clobetasol Prop Emollient Base (CLOBETASOL PROPIONATE E) 0.05 % emollient cream, Apply topically., Disp: , Rfl:  .  docusate sodium (STOOL SOFTENER) 100 MG capsule, Take 100 mg by mouth 2 (two) times daily., Disp: , Rfl:  .  DULoxetine (CYMBALTA) 30 MG capsule, Take by mouth., Disp: , Rfl:  .  gabapentin (NEURONTIN) 100 MG capsule, Take 1 capsule (100 mg total) by mouth 3 (three) times daily., Disp: 90 capsule, Rfl: 2 .  meloxicam (MOBIC) 15 MG tablet, Take 15 mg by mouth daily as needed for pain., Disp: , Rfl:  .  metoCLOPramide (REGLAN) 10 MG tablet, Take 1 tablet (10 mg total) by mouth every 6 (six) hours as needed for nausea., Disp: 40 tablet, Rfl: 1 .  Multiple Vitamins-Minerals (MULTIVITAMIN ADULT PO), Take by mouth daily., Disp: , Rfl:  .  ondansetron (ZOFRAN) 4 MG tablet, 4 mg as needed. , Disp: , Rfl:  .  ranitidine (ZANTAC) 75 MG tablet, Take 75 mg by mouth 2 (two) times daily., Disp: , Rfl:  .  sulfaSALAzine (AZULFIDINE) 500 MG EC tablet, Take 1,500 mg by mouth daily. , Disp: , Rfl: 3 .  SUMAtriptan (IMITREX) 25 MG tablet, Take as needed by mouth. , Disp: , Rfl:  .  traMADol (ULTRAM) 50 MG tablet, Take 1 tablet (50 mg total) by mouth every 6 (six) hours as needed., Disp: 120 tablet, Rfl: 2 .  triamcinolone (  KENALOG) 0.1 % paste, Apply to affected area(s) 2 times per day as needed, Disp: , Rfl:   Current Facility-Administered Medications:  .  albuterol (PROVENTIL) (2.5 MG/3ML) 0.083% nebulizer solution 2.5 mg, 2.5 mg, Nebulization, Once, Particia Nearing, PA-C  Assessment and Plan: 1. Fibromyalgia syndrome   2. Behcet's syndrome (HCC)   3. Neuropathic pain   4. Chronic, continuous use of opioids   5. 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 Pain in both wrists   7. Chronic pain of both knees   8. Chronic pain syndrome   Based on review of  the [facility] practitioner database information and upon discussion with the patient we will refill her medications for the gabapentin and her tramadol.  No other changes are made today in her pain control protocol.  She is encouraged to continue with her physical therapy and follow-up with her primary care physicians for baseline medical care with return to clinic approximately 3 months.  Follow Up Instructions:    I discussed the assessment and treatment plan with the patient. The patient was provided an opportunity to ask questions and all were answered. The patient agreed with the plan and demonstrated an understanding of the instructions.   The patient was advised to call back or seek an in-person evaluation if the symptoms worsen or if the condition fails to improve as anticipated.  I provided 30 minutes of non-face-to-face time during this encounter.   [name], MD

## 2019-10-21 ENCOUNTER — Other Ambulatory Visit: Payer: Self-pay | Admitting: Rehabilitative and Restorative Service Providers"

## 2020-01-14 ENCOUNTER — Other Ambulatory Visit: Payer: Self-pay | Admitting: Anesthesiology

## 2020-01-14 ENCOUNTER — Other Ambulatory Visit: Payer: Self-pay

## 2020-01-14 ENCOUNTER — Encounter: Payer: Self-pay | Admitting: Anesthesiology

## 2020-01-14 ENCOUNTER — Ambulatory Visit: Payer: 59 | Attending: Anesthesiology | Admitting: Anesthesiology

## 2020-01-14 DIAGNOSIS — M797 Fibromyalgia: Secondary | ICD-10-CM | POA: Diagnosis not present

## 2020-01-14 DIAGNOSIS — M352 Behcet's disease: Secondary | ICD-10-CM | POA: Diagnosis not present

## 2020-01-14 DIAGNOSIS — M792 Neuralgia and neuritis, unspecified: Secondary | ICD-10-CM | POA: Diagnosis not present

## 2020-01-14 DIAGNOSIS — M06031 Rheumatoid arthritis without rheumatoid factor, right wrist: Secondary | ICD-10-CM

## 2020-01-14 DIAGNOSIS — G894 Chronic pain syndrome: Secondary | ICD-10-CM

## 2020-01-14 DIAGNOSIS — M25531 Pain in right wrist: Secondary | ICD-10-CM

## 2020-01-14 DIAGNOSIS — F1190 Opioid use, unspecified, uncomplicated: Secondary | ICD-10-CM | POA: Diagnosis not present

## 2020-01-14 DIAGNOSIS — M25562 Pain in left knee: Secondary | ICD-10-CM

## 2020-01-14 DIAGNOSIS — M06032 Rheumatoid arthritis without rheumatoid factor, left wrist: Secondary | ICD-10-CM

## 2020-01-14 DIAGNOSIS — M25532 Pain in left wrist: Secondary | ICD-10-CM

## 2020-01-14 DIAGNOSIS — G8929 Other chronic pain: Secondary | ICD-10-CM

## 2020-01-14 DIAGNOSIS — M25561 Pain in right knee: Secondary | ICD-10-CM

## 2020-01-14 DIAGNOSIS — G2581 Restless legs syndrome: Secondary | ICD-10-CM

## 2020-01-14 HISTORY — DX: Restless legs syndrome: G25.81

## 2020-01-14 MED ORDER — TRAMADOL HCL 50 MG PO TABS: 50.0000 mg | ORAL_TABLET | Freq: Four times a day (QID) | ORAL | 2 refills | Status: DC | PRN

## 2020-01-14 MED ORDER — GABAPENTIN 100 MG PO CAPS: 100.0000 mg | ORAL_CAPSULE | Freq: Four times a day (QID) | ORAL | 2 refills | Status: DC

## 2020-01-14 NOTE — Addendum Note (Signed)
Addended by: [name] on: 01/14/2020 02:00 PM   Modules accepted: Orders

## 2020-01-14 NOTE — Progress Notes (Signed)
Virtual Visit via Telephone Note  I connected with Rebekah Martin on 01/14/20 at  1:20 PM EST by telephone and verified that I am speaking with the correct person using two identifiers.  Location: Patient: Home Provider: Pain control center   I discussed the limitations, risks, security and privacy concerns of performing an evaluation and management service by telephone and the availability of in person appointments. I also discussed with the patient that there may be a patient responsible charge related to this service. The patient expressed understanding and agreed to proceed.   History of Present Illness: I spoke with Rebekah Martin today via telephone as she was unable to link up for the video portion of the virtual conference.  She reports that she is more or less status quo with her back pain and wrist pain.  She still taking tramadol approximately 3 times a day on average occasionally 1/4 tablet if needed but this generally works well keeping her pain under good control.  She has noted that her restless leg syndrome symptoms are worse at night as of recent.  No other changes in her lower extremity strength or function or bowel or bladder function is noted.  Otherwise she is in her usual state of health trying to do her stretching strengthening exercises and physical therapy as best tolerated.   Observations/Objective:  Current Outpatient Medications:  .  Adalimumab (HUMIRA PEN) 40 MG/0.8ML PNKT, Inject 0.8 mLs into the skin every 14 (fourteen) days., Disp: 2 each, Rfl: 11 .  Adalimumab 40 MG/0.8ML PNKT, Inject into the skin., Disp: , Rfl:  .  albuterol (PROVENTIL HFA;VENTOLIN HFA) 108 (90 Base) MCG/ACT inhaler, Inhale into the lungs., Disp: , Rfl:  .  albuterol (PROVENTIL) (2.5 MG/3ML) 0.083% nebulizer solution, Inhale into the lungs., Disp: , Rfl:  .  Calcium Carbonate-Vit D-Min (CALCIUM 600 + MINERALS PO), Take 600 mg by mouth 2 (two) times daily., Disp: , Rfl:  .  clobetasol cream (TEMOVATE) 0.05  %, Apply 1 application topically 2 (two) times daily., Disp: 60 g, Rfl: 0 .  Clobetasol Prop Emollient Base (CLOBETASOL PROPIONATE E) 0.05 % emollient cream, Apply topically., Disp: , Rfl:  .  docusate sodium (STOOL SOFTENER) 100 MG capsule, Take 100 mg by mouth 2 (two) times daily., Disp: , Rfl:  .  DULoxetine (CYMBALTA) 30 MG capsule, Take by mouth., Disp: , Rfl:  .  gabapentin (NEURONTIN) 100 MG capsule, Take 1 capsule (100 mg total) by mouth 3 (three) times daily., Disp: 90 capsule, Rfl: 2 .  meloxicam (MOBIC) 15 MG tablet, Take 15 mg by mouth daily as needed for pain., Disp: , Rfl:  .  metoCLOPramide (REGLAN) 10 MG tablet, Take 1 tablet (10 mg total) by mouth every 6 (six) hours as needed for nausea., Disp: 40 tablet, Rfl: 1 .  Multiple Vitamins-Minerals (MULTIVITAMIN ADULT PO), Take by mouth daily., Disp: , Rfl:  .  ondansetron (ZOFRAN) 4 MG tablet, 4 mg as needed. , Disp: , Rfl:  .  ranitidine (ZANTAC) 75 MG tablet, Take 75 mg by mouth 2 (two) times daily., Disp: , Rfl:  .  sulfaSALAzine (AZULFIDINE) 500 MG EC tablet, Take 1,500 mg by mouth daily. , Disp: , Rfl: 3 .  SUMAtriptan (IMITREX) 25 MG tablet, Take as needed by mouth. , Disp: , Rfl:  .  traMADol (ULTRAM) 50 MG tablet, Take 1 tablet (50 mg total) by mouth every 6 (six) hours as needed., Disp: 120 tablet, Rfl: 2 .  triamcinolone (KENALOG) 0.1 % paste,  Apply to affected area(s) 2 times per day as needed, Disp: , Rfl:   Current Facility-Administered Medications:  .  albuterol (PROVENTIL) (2.5 MG/3ML) 0.083% nebulizer solution 2.5 mg, 2.5 mg, Nebulization, Once, Particia Nearing, PA-C  Assessment and Plan: 1. Fibromyalgia syndrome   2. Behcet's syndrome (HCC)   3. Neuropathic pain   4. Chronic, continuous use of opioids   5. 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 Pain in both wrists   7. Chronic pain of both knees   8. Chronic pain syndrome   9. Restless legs syndrome   Based  on discussion today and upon review of the [facility] practitioner database information going to refill her medications for the next 3 months as before tramadol 50 mg tablets 1 3-4 times per day.  I am also having her increase her gabapentin to 4 tablets at night of the 100 mg strength for the next week to see if this can help with the restless leg symptoms.  In a week if this is not better she can increase to 5 tablets per night as a maximum dose at the 100 mg strength for total of 500 mg nightly.  If this does not improve she is instructed to contact us and we may consider switching her over to Lyrica.  No other changes will be made in her protocol at this point with return to clinic expected in approximately 3 months.  Continue follow-up with her primary care physicians as reviewed today for her baseline medical care.  Follow Up Instructions:    I discussed the assessment and treatment plan with the patient. The patient was provided an opportunity to ask questions and all were answered. The patient agreed with the plan and demonstrated an understanding of the instructions.   The patient was advised to call back or seek an in-person evaluation if the symptoms worsen or if the condition fails to improve as anticipated.  I provided 30 minutes of non-face-to-face time during this encounter.   [name], MD

## 2020-02-12 ENCOUNTER — Other Ambulatory Visit: Payer: Self-pay | Admitting: Anesthesiology

## 2020-03-09 ENCOUNTER — Other Ambulatory Visit: Payer: 59

## 2020-04-15 ENCOUNTER — Other Ambulatory Visit: Payer: Self-pay

## 2020-04-15 ENCOUNTER — Ambulatory Visit: Payer: 59 | Attending: Anesthesiology | Admitting: Anesthesiology

## 2020-04-15 DIAGNOSIS — M06031 Rheumatoid arthritis without rheumatoid factor, right wrist: Secondary | ICD-10-CM

## 2020-04-15 DIAGNOSIS — M25531 Pain in right wrist: Secondary | ICD-10-CM

## 2020-04-15 DIAGNOSIS — M797 Fibromyalgia: Secondary | ICD-10-CM

## 2020-04-15 DIAGNOSIS — M25532 Pain in left wrist: Secondary | ICD-10-CM

## 2020-04-15 DIAGNOSIS — M06032 Rheumatoid arthritis without rheumatoid factor, left wrist: Secondary | ICD-10-CM

## 2020-04-15 DIAGNOSIS — M352 Behcet's disease: Secondary | ICD-10-CM | POA: Diagnosis not present

## 2020-04-15 DIAGNOSIS — M792 Neuralgia and neuritis, unspecified: Secondary | ICD-10-CM | POA: Diagnosis not present

## 2020-04-15 DIAGNOSIS — F1190 Opioid use, unspecified, uncomplicated: Secondary | ICD-10-CM

## 2020-04-15 DIAGNOSIS — M25562 Pain in left knee: Secondary | ICD-10-CM

## 2020-04-15 DIAGNOSIS — M25561 Pain in right knee: Secondary | ICD-10-CM

## 2020-04-15 DIAGNOSIS — G894 Chronic pain syndrome: Secondary | ICD-10-CM

## 2020-04-15 DIAGNOSIS — G8929 Other chronic pain: Secondary | ICD-10-CM

## 2020-04-16 ENCOUNTER — Other Ambulatory Visit: Payer: Self-pay | Admitting: Anesthesiology

## 2020-04-16 ENCOUNTER — Encounter: Payer: Self-pay | Admitting: Anesthesiology

## 2020-04-16 MED ORDER — TRAMADOL HCL 50 MG PO TABS: 50.0000 mg | ORAL_TABLET | Freq: Four times a day (QID) | ORAL | 2 refills | Status: DC | PRN

## 2020-04-16 MED ORDER — GABAPENTIN 100 MG PO CAPS: 100.0000 mg | ORAL_CAPSULE | Freq: Four times a day (QID) | ORAL | 2 refills | Status: DC

## 2020-04-17 NOTE — Progress Notes (Signed)
Virtual Visit via Telephone Note  I connected with Rebekah Martin on 04/17/20 at  2:15 PM EST by telephone and verified that I am speaking with the correct person using two identifiers.  Location: Patient: Home Provider: Pain control center   I discussed the limitations, risks, security and privacy concerns of performing an evaluation and management service by telephone and the availability of in person appointments. I also discussed with the patient that there may be a patient responsible charge related to this service. The patient expressed understanding and agreed to proceed.   History of Present Illness: I spoke with Rebekah Martin via telephone as she was unable to do the video portion of the virtual conference and she reports that she has been doing well with her pain management protocol and the tramadol and gabapentin.  The quality characteristic distribution of her thoracic pain is stable with no change noted.  Her upper extremity and lower extremity strength is also been stable.  She tries to stay active as best she can but is limited by the confines of the pain syndrome she reports.  She is taking her medications as prescribed with no side effects reported and getting good relief and staying functional.  Otherwise she is in her usual state of health.  No other changes are reported and the location of the pain has been stable in nature.   Observations/Objective:  Current Outpatient Medications:  .  Adalimumab (HUMIRA PEN) 40 MG/0.8ML PNKT, Inject 0.8 mLs into the skin every 14 (fourteen) days., Disp: 2 each, Rfl: 11 .  Adalimumab 40 MG/0.8ML PNKT, Inject into the skin., Disp: , Rfl:  .  albuterol (PROVENTIL HFA;VENTOLIN HFA) 108 (90 Base) MCG/ACT inhaler, Inhale into the lungs., Disp: , Rfl:  .  albuterol (PROVENTIL) (2.5 MG/3ML) 0.083% nebulizer solution, Inhale into the lungs., Disp: , Rfl:  .  Calcium Carbonate-Vit D-Min (CALCIUM 600 + MINERALS PO), Take 600 mg by mouth 2 (two) times daily.,  Disp: , Rfl:  .  clobetasol cream (TEMOVATE) 0.05 %, Apply 1 application topically 2 (two) times daily., Disp: 60 g, Rfl: 0 .  Clobetasol Prop Emollient Base (CLOBETASOL PROPIONATE E) 0.05 % emollient cream, Apply topically., Disp: , Rfl:  .  docusate sodium (STOOL SOFTENER) 100 MG capsule, Take 100 mg by mouth 2 (two) times daily., Disp: , Rfl:  .  DULoxetine (CYMBALTA) 30 MG capsule, Take by mouth., Disp: , Rfl:  .  gabapentin (NEURONTIN) 100 MG capsule, Take 1 capsule (100 mg total) by mouth 4 (four) times daily., Disp: 120 capsule, Rfl: 2 .  meloxicam (MOBIC) 15 MG tablet, Take 15 mg by mouth daily as needed for pain., Disp: , Rfl:  .  metoCLOPramide (REGLAN) 10 MG tablet, Take 1 tablet (10 mg total) by mouth every 6 (six) hours as needed for nausea., Disp: 40 tablet, Rfl: 1 .  Multiple Vitamins-Minerals (MULTIVITAMIN ADULT PO), Take by mouth daily., Disp: , Rfl:  .  ondansetron (ZOFRAN) 4 MG tablet, 4 mg as needed. , Disp: , Rfl:  .  ranitidine (ZANTAC) 75 MG tablet, Take 75 mg by mouth 2 (two) times daily., Disp: , Rfl:  .  sulfaSALAzine (AZULFIDINE) 500 MG EC tablet, Take 1,500 mg by mouth daily. , Disp: , Rfl: 3 .  SUMAtriptan (IMITREX) 25 MG tablet, Take as needed by mouth. , Disp: , Rfl:  .  traMADol (ULTRAM) 50 MG tablet, Take 1 tablet (50 mg total) by mouth every 6 (six) hours as needed., Disp: 120 tablet, Rfl:  2 .  triamcinolone (KENALOG) 0.1 % paste, Apply to affected area(s) 2 times per day as needed, Disp: , Rfl:   Current Facility-Administered Medications:  .  albuterol (PROVENTIL) (2.5 MG/3ML) 0.083% nebulizer solution 2.5 mg, 2.5 mg, Nebulization, Once, Particia Nearing, PA-C  Assessment and Plan: 1. Fibromyalgia syndrome   2. Behcet's syndrome (HCC)   3. Neuropathic pain   4. Chronic, continuous use of opioids   5. 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 Pain in both wrists   7. Chronic pain of both knees   8. Chronic  pain syndrome   Based on our discussion today and upon review of the [facility] practitioner database information I am going to refill her medications for her tramadol and gabapentin.  She is doing well with this current regimen with no changes initiated or problems with the regimen.  We will schedule her for a 58-month return to clinic and I have encouraged her to continue follow-up with her primary care physicians and continue with her stretching strengthening exercises once again reviewed.  Follow Up Instructions:    I discussed the assessment and treatment plan with the patient. The patient was provided an opportunity to ask questions and all were answered. The patient agreed with the plan and demonstrated an understanding of the instructions.   The patient was advised to call back or seek an in-person evaluation if the symptoms worsen or if the condition fails to improve as anticipated.  I provided 25 minutes of non-face-to-face time during this encounter.   [name], MD

## 2020-06-20 ENCOUNTER — Other Ambulatory Visit: Payer: Self-pay

## 2020-06-20 MED FILL — Tramadol HCl Tab 50 MG: ORAL | 30 days supply | Qty: 120 | Fill #0 | Status: AC

## 2020-06-20 MED FILL — Gabapentin Cap 100 MG: ORAL | 30 days supply | Qty: 120 | Fill #0 | Status: AC

## 2020-07-07 ENCOUNTER — Other Ambulatory Visit: Payer: Self-pay

## 2020-07-07 ENCOUNTER — Encounter: Payer: Self-pay | Admitting: Anesthesiology

## 2020-07-07 ENCOUNTER — Ambulatory Visit: Payer: 59 | Attending: Anesthesiology | Admitting: Anesthesiology

## 2020-07-07 DIAGNOSIS — M797 Fibromyalgia: Secondary | ICD-10-CM

## 2020-07-07 DIAGNOSIS — M06031 Rheumatoid arthritis without rheumatoid factor, right wrist: Secondary | ICD-10-CM

## 2020-07-07 DIAGNOSIS — M25532 Pain in left wrist: Secondary | ICD-10-CM

## 2020-07-07 DIAGNOSIS — M352 Behcet's disease: Secondary | ICD-10-CM | POA: Diagnosis not present

## 2020-07-07 DIAGNOSIS — M25561 Pain in right knee: Secondary | ICD-10-CM

## 2020-07-07 DIAGNOSIS — G894 Chronic pain syndrome: Secondary | ICD-10-CM

## 2020-07-07 DIAGNOSIS — F1190 Opioid use, unspecified, uncomplicated: Secondary | ICD-10-CM | POA: Diagnosis not present

## 2020-07-07 DIAGNOSIS — G8929 Other chronic pain: Secondary | ICD-10-CM

## 2020-07-07 DIAGNOSIS — M25531 Pain in right wrist: Secondary | ICD-10-CM

## 2020-07-07 DIAGNOSIS — M792 Neuralgia and neuritis, unspecified: Secondary | ICD-10-CM

## 2020-07-07 DIAGNOSIS — G2581 Restless legs syndrome: Secondary | ICD-10-CM

## 2020-07-07 DIAGNOSIS — M25562 Pain in left knee: Secondary | ICD-10-CM

## 2020-07-07 DIAGNOSIS — M06032 Rheumatoid arthritis without rheumatoid factor, left wrist: Secondary | ICD-10-CM

## 2020-07-07 MED ORDER — GABAPENTIN 100 MG PO CAPS
100.0000 mg | ORAL_CAPSULE | Freq: Four times a day (QID) | ORAL | 2 refills | Status: DC
  Filled 2020-07-07 – 2020-07-20 (×2): qty 120, 30d supply, fill #0
  Filled 2020-08-18: qty 120, 30d supply, fill #1
  Filled 2020-09-19 – 2020-09-23 (×2): qty 120, 30d supply, fill #2

## 2020-07-07 MED ORDER — TRAMADOL HCL 50 MG PO TABS
50.0000 mg | ORAL_TABLET | Freq: Four times a day (QID) | ORAL | 2 refills | Status: DC | PRN
  Filled 2020-07-07 – 2020-07-20 (×2): qty 120, 30d supply, fill #0
  Filled 2020-08-18: qty 120, 30d supply, fill #1
  Filled 2020-09-19 – 2020-09-22 (×2): qty 120, 30d supply, fill #2

## 2020-07-07 NOTE — Progress Notes (Signed)
Virtual Visit via Telephone Note  I connected with Rebekah Martin on 07/07/20 at  3:00 PM EDT by telephone and verified that I am speaking with the correct person using two identifiers.  Location: Patient: Home Provider: Pain control center   I discussed the limitations, risks, security and privacy concerns of performing an evaluation and management service by telephone and the availability of in person appointments. I also discussed with the patient that there may be a patient responsible charge related to this service. The patient expressed understanding and agreed to proceed.   History of Present Illness: I spoke with Rebekah Martin via telephone as we were unable to link for the video portion of the virtual conference but she reports that she is doing well.  The quality characteristic and distribution of her pain remain the same.  She is taking her tramadol 4 times a day and this continues to work well for her.  No side effects are reported.  She takes her gabapentin 4 times a day as well and this helps with the pain as well as sedation at night and sleep.  This combination keeps her fibromyalgia and diffuse body pain under good control.  She is continue to try and do stretching strengthening exercises and stay active and otherwise reports that she is doing reasonably well.   Observations/Objective:  Current Outpatient Medications:  .  Adalimumab (HUMIRA PEN) 40 MG/0.8ML PNKT, Inject 0.8 mLs into the skin every 14 (fourteen) days., Disp: 2 each, Rfl: 11 .  Adalimumab 40 MG/0.8ML PNKT, Inject into the skin., Disp: , Rfl:  .  albuterol (PROVENTIL HFA;VENTOLIN HFA) 108 (90 Base) MCG/ACT inhaler, Inhale into the lungs., Disp: , Rfl:  .  albuterol (PROVENTIL) (2.5 MG/3ML) 0.083% nebulizer solution, Inhale into the lungs., Disp: , Rfl:  .  Calcium Carbonate-Vit D-Min (CALCIUM 600 + MINERALS PO), Take 600 mg by mouth 2 (two) times daily., Disp: , Rfl:  .  clobetasol cream (TEMOVATE) 0.05 %, Apply 1  application topically 2 (two) times daily., Disp: 60 g, Rfl: 0 .  Clobetasol Prop Emollient Base (CLOBETASOL PROPIONATE E) 0.05 % emollient cream, Apply topically., Disp: , Rfl:  .  docusate sodium (STOOL SOFTENER) 100 MG capsule, Take 100 mg by mouth 2 (two) times daily., Disp: , Rfl:  .  DULoxetine (CYMBALTA) 30 MG capsule, Take by mouth., Disp: , Rfl:  .  DULoxetine (CYMBALTA) 30 MG capsule, TAKE 1 CAPSULE (30 MG TOTAL) BY MOUTH ONCE DAILY, Disp: 90 capsule, Rfl: 1 .  gabapentin (NEURONTIN) 100 MG capsule, Take 1 capsule (100 mg total) by mouth 4 (four) times daily., Disp: 120 capsule, Rfl: 2 .  meloxicam (MOBIC) 15 MG tablet, Take 15 mg by mouth daily as needed for pain., Disp: , Rfl:  .  metoCLOPramide (REGLAN) 10 MG tablet, Take 1 tablet (10 mg total) by mouth every 6 (six) hours as needed for nausea., Disp: 40 tablet, Rfl: 1 .  Multiple Vitamins-Minerals (MULTIVITAMIN ADULT PO), Take by mouth daily., Disp: , Rfl:  .  ondansetron (ZOFRAN) 4 MG tablet, 4 mg as needed. , Disp: , Rfl:  .  ondansetron (ZOFRAN-ODT) 4 MG disintegrating tablet, DISSOLVE 1 TABLET BY MOUTH EVERY 8 HOURS AS NEEDED FOR NAUSEA FOR UP TO 7 DAYS, Disp: 20 tablet, Rfl: 0 .  propranolol (INDERAL) 10 MG tablet, TAKE 1 TABLET BY MOUTH TWICE DAILY, Disp: 60 tablet, Rfl: 11 .  ranitidine (ZANTAC) 75 MG tablet, Take 75 mg by mouth 2 (two) times daily., Disp: ,  Rfl:  .  sulfaSALAzine (AZULFIDINE) 500 MG EC tablet, Take 1,500 mg by mouth daily. , Disp: , Rfl: 3 .  SUMAtriptan (IMITREX) 25 MG tablet, Take as needed by mouth. , Disp: , Rfl:  .  traMADol (ULTRAM) 50 MG tablet, Take 1 tablet (50 mg total) by mouth every 6 (six) hours as needed., Disp: 120 tablet, Rfl: 2 .  triamcinolone (KENALOG) 0.1 % paste, Apply to affected area(s) 2 times per day as needed, Disp: , Rfl:   Current Facility-Administered Medications:  .  albuterol (PROVENTIL) (2.5 MG/3ML) 0.083% nebulizer solution 2.5 mg, 2.5 mg, Nebulization, Once, Particia Nearing, PA-C  Assessment and Plan:  1. Fibromyalgia syndrome   2. Behcet's syndrome (HCC)   3. Neuropathic pain   4. Chronic, continuous use of opioids   5. 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 Pain in both wrists   7. Chronic pain of both knees   8. Chronic pain syndrome   9. Restless legs syndrome   10. Chronic pain of both wrists   Based on her discussion today and upon review of the [facility] practitioner database information is appropriate to refill her tramadol.  This was done for the next 2-month supply.  We will keep her on the gabapentin at the same dose.  No other changes are made today.  I encouraged her to continue with exercises as previously discussed with stretching and strengthening.  She is to contact us should she have any change in the quality or characteristic of her pain.  We will schedule her for 44-month return to clinic.  She is to continue follow-up with her primary care physician for baseline medical care. Follow Up Instructions:    I discussed the assessment and treatment plan with the patient. The patient was provided an opportunity to ask questions and all were answered. The patient agreed with the plan and demonstrated an understanding of the instructions.   The patient was advised to call back or seek an in-person evaluation if the symptoms worsen or if the condition fails to improve as anticipated.  I provided 30 minutes of non-face-to-face time during this encounter.   [name], MD

## 2020-07-20 ENCOUNTER — Other Ambulatory Visit: Payer: Self-pay

## 2020-08-18 ENCOUNTER — Other Ambulatory Visit: Payer: Self-pay

## 2020-09-19 ENCOUNTER — Other Ambulatory Visit: Payer: Self-pay

## 2020-09-22 ENCOUNTER — Other Ambulatory Visit: Payer: Self-pay

## 2020-09-22 ENCOUNTER — Encounter: Payer: Self-pay | Admitting: Anesthesiology

## 2020-09-23 ENCOUNTER — Telehealth: Payer: Self-pay

## 2020-09-23 ENCOUNTER — Other Ambulatory Visit: Payer: Self-pay

## 2020-09-23 NOTE — Telephone Encounter (Signed)
Good evening My insurance is no longer allowing me to get my tramadol and gabapentin refilled at the [facility] pharmacy.  I had 1 refill remaining on both and I am asking for those 2 meds to please be called into the [facility]. Their phone number is [phone]. I also would need to go ahead and get scheduled for a med follow up appt since this will be my last refill. Thank you so much for your time!  Attachments [id number]

## 2020-09-26 MED ORDER — TRAMADOL HCL 50 MG PO TABS: 50.0000 mg | ORAL_TABLET | Freq: Four times a day (QID) | ORAL | 2 refills | Status: DC | PRN

## 2020-09-26 MED ORDER — GABAPENTIN 100 MG PO CAPS: 100.0000 mg | ORAL_CAPSULE | Freq: Four times a day (QID) | ORAL | 2 refills | Status: DC

## 2020-09-26 NOTE — Telephone Encounter (Signed)
Done.  ja

## 2020-09-26 NOTE — Addendum Note (Signed)
Addended by: [name] on: 09/26/2020 10:46 AM   Modules accepted: Orders

## 2020-09-28 ENCOUNTER — Other Ambulatory Visit: Payer: Self-pay

## 2020-09-28 MED FILL — Propranolol HCl Tab 10 MG: ORAL | 30 days supply | Qty: 60 | Fill #0 | Status: CN

## 2020-12-29 ENCOUNTER — Ambulatory Visit: Payer: 59 | Attending: Anesthesiology | Admitting: Anesthesiology

## 2020-12-29 ENCOUNTER — Encounter: Payer: Self-pay | Admitting: Anesthesiology

## 2020-12-29 ENCOUNTER — Other Ambulatory Visit: Payer: Self-pay

## 2020-12-29 DIAGNOSIS — M792 Neuralgia and neuritis, unspecified: Secondary | ICD-10-CM | POA: Diagnosis not present

## 2020-12-29 DIAGNOSIS — M25531 Pain in right wrist: Secondary | ICD-10-CM

## 2020-12-29 DIAGNOSIS — M352 Behcet's disease: Secondary | ICD-10-CM | POA: Diagnosis not present

## 2020-12-29 DIAGNOSIS — F1190 Opioid use, unspecified, uncomplicated: Secondary | ICD-10-CM | POA: Diagnosis not present

## 2020-12-29 DIAGNOSIS — M25561 Pain in right knee: Secondary | ICD-10-CM

## 2020-12-29 DIAGNOSIS — M25562 Pain in left knee: Secondary | ICD-10-CM

## 2020-12-29 DIAGNOSIS — G8929 Other chronic pain: Secondary | ICD-10-CM

## 2020-12-29 DIAGNOSIS — M797 Fibromyalgia: Secondary | ICD-10-CM | POA: Diagnosis not present

## 2020-12-29 DIAGNOSIS — M25532 Pain in left wrist: Secondary | ICD-10-CM

## 2020-12-29 DIAGNOSIS — M06032 Rheumatoid arthritis without rheumatoid factor, left wrist: Secondary | ICD-10-CM

## 2020-12-29 DIAGNOSIS — M06031 Rheumatoid arthritis without rheumatoid factor, right wrist: Secondary | ICD-10-CM

## 2020-12-29 MED ORDER — GABAPENTIN 100 MG PO CAPS
100.0000 mg | ORAL_CAPSULE | Freq: Four times a day (QID) | ORAL | 2 refills | Status: DC
Start: 2020-12-29 — End: 2021-07-11

## 2020-12-29 MED ORDER — TRAMADOL HCL 50 MG PO TABS: 50.0000 mg | ORAL_TABLET | Freq: Four times a day (QID) | ORAL | 2 refills | Status: DC | PRN

## 2020-12-29 NOTE — Progress Notes (Signed)
Virtual Visit via Telephone Note  I connected with Rebekah Martin on 12/29/20 at  2:15 PM EDT by telephone and verified that I am speaking with the correct person using two identifiers.  Location: Patient: Home Provider: Pain control center   I discussed the limitations, risks, security and privacy concerns of performing an evaluation and management service by telephone and the availability of in person appointments. I also discussed with the patient that there may be a patient responsible charge related to this service. The patient expressed understanding and agreed to proceed.   History of Present Illness: I spoke with Rebekah Martin via telephone today as we are unable to link for the video portion of virtual conference and she states that she has been doing reasonably well.  She still having persistent hand wrist and arm pain.  She occasionally gets some back pain but most of her symptoms have been stable in nature with no change reported today.  She takes tramadol 4 times a day and this keeps most of the pain under good control.  She tries to stay active.  Additionally she takes her gabapentin for her restless leg issues and the neuropathic component of her pain.  It is also well-tolerated with no side effects.  She denies any diverting or illicit use and has been stable on this regimen she reports for a very long time.  Otherwise she is doing her exercises as tolerated.  She recently changed jobs and is now working virtually for company in [place] and this is helped her as well.  Otherwise she is in her usual state of health.  Review of systems: General: No fevers or chills Pulmonary: No shortness of breath or dyspnea Cardiac: No angina or palpitations or lightheadedness GI: No abdominal pain or constipation Psych: No depression    Observations/Objective:  Current Outpatient Medications:    Adalimumab (HUMIRA PEN) 40 MG/0.8ML PNKT, Inject 0.8 mLs into the skin every 14 (fourteen) days., Disp: 2  each, Rfl: 11   Adalimumab 40 MG/0.8ML PNKT, Inject into the skin., Disp: , Rfl:    albuterol (PROVENTIL HFA;VENTOLIN HFA) 108 (90 Base) MCG/ACT inhaler, Inhale into the lungs., Disp: , Rfl:    albuterol (PROVENTIL) (2.5 MG/3ML) 0.083% nebulizer solution, Inhale into the lungs., Disp: , Rfl:    Calcium Carbonate-Vit D-Min (CALCIUM 600 + MINERALS PO), Take 600 mg by mouth 2 (two) times daily., Disp: , Rfl:    clobetasol cream (TEMOVATE) 0.05 %, Apply 1 application topically 2 (two) times daily., Disp: 60 g, Rfl: 0   Clobetasol Prop Emollient Base (CLOBETASOL PROPIONATE E) 0.05 % emollient cream, Apply topically., Disp: , Rfl:    docusate sodium (STOOL SOFTENER) 100 MG capsule, Take 100 mg by mouth 2 (two) times daily., Disp: , Rfl:    DULoxetine (CYMBALTA) 30 MG capsule, Take by mouth., Disp: , Rfl:    DULoxetine (CYMBALTA) 30 MG capsule, TAKE 1 CAPSULE (30 MG TOTAL) BY MOUTH ONCE DAILY, Disp: 90 capsule, Rfl: 1   gabapentin (NEURONTIN) 100 MG capsule, Take 1 capsule (100 mg total) by mouth 4 (four) times daily., Disp: 120 capsule, Rfl: 2   meloxicam (MOBIC) 15 MG tablet, Take 15 mg by mouth daily as needed for pain., Disp: , Rfl:    metoCLOPramide (REGLAN) 10 MG tablet, Take 1 tablet (10 mg total) by mouth every 6 (six) hours as needed for nausea., Disp: 40 tablet, Rfl: 1   Multiple Vitamins-Minerals (MULTIVITAMIN ADULT PO), Take by mouth daily., Disp: , Rfl:  ondansetron (ZOFRAN) 4 MG tablet, 4 mg as needed. , Disp: , Rfl:    ondansetron (ZOFRAN-ODT) 4 MG disintegrating tablet, DISSOLVE 1 TABLET BY MOUTH EVERY 8 HOURS AS NEEDED FOR NAUSEA FOR UP TO 7 DAYS, Disp: 20 tablet, Rfl: 0   propranolol (INDERAL) 10 MG tablet, TAKE 1 TABLET BY MOUTH TWICE DAILY, Disp: 60 tablet, Rfl: 11   ranitidine (ZANTAC) 75 MG tablet, Take 75 mg by mouth 2 (two) times daily., Disp: , Rfl:    sulfaSALAzine (AZULFIDINE) 500 MG EC tablet, Take 1,500 mg by mouth daily. , Disp: , Rfl: 3   SUMAtriptan (IMITREX) 25 MG  tablet, Take as needed by mouth. , Disp: , Rfl:    traMADol (ULTRAM) 50 MG tablet, Take 1 tablet (50 mg total) by mouth every 6 (six) hours as needed., Disp: 120 tablet, Rfl: 2   triamcinolone (KENALOG) 0.1 % paste, Apply to affected area(s) 2 times per day as needed, Disp: , Rfl:   Current Facility-Administered Medications:    albuterol (PROVENTIL) (2.5 MG/3ML) 0.083% nebulizer solution 2.5 mg, 2.5 mg, Nebulization, Once, Particia Nearing, PA-C   Assessment and Plan: 1. Fibromyalgia syndrome   2. Behcet's syndrome (HCC)   3. Neuropathic pain   4. Chronic, continuous use of opioids   5. 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 Pain in both wrists   7. Chronic pain of both knees   Based on our discussion today and in review of the [facility] practitioner database information I think is appropriate for her refills.  These will be called in today.  This afraid for her tramadol 4 tablets/day on a daily basis.  Also I am refilling her gabapentin to help with the neuropathic component of her pain and her restless leg issues.  I want her to continue activities as reviewed and continue follow-up with her primary care physicians with scheduled return in 3 months.  Follow Up Instructions:    I discussed the assessment and treatment plan with the patient. The patient was provided an opportunity to ask questions and all were answered. The patient agreed with the plan and demonstrated an understanding of the instructions.   The patient was advised to call back or seek an in-person evaluation if the symptoms worsen or if the condition fails to improve as anticipated.  I provided 25 minutes of non-face-to-face time during this encounter.   [name], MD

## 2021-07-11 ENCOUNTER — Other Ambulatory Visit: Payer: Self-pay

## 2021-07-11 ENCOUNTER — Encounter: Payer: Self-pay | Admitting: Anesthesiology

## 2021-07-11 ENCOUNTER — Ambulatory Visit: Payer: PRIVATE HEALTH INSURANCE | Attending: Anesthesiology | Admitting: Anesthesiology

## 2021-07-11 VITALS — BP 136/84 | HR 95 | Temp 97.8°F | Resp 16 | Ht 64.0 in | Wt 130.0 lb

## 2021-07-11 DIAGNOSIS — F1190 Opioid use, unspecified, uncomplicated: Secondary | ICD-10-CM | POA: Insufficient documentation

## 2021-07-11 DIAGNOSIS — M06031 Rheumatoid arthritis without rheumatoid factor, right wrist: Secondary | ICD-10-CM | POA: Diagnosis present

## 2021-07-11 DIAGNOSIS — M06032 Rheumatoid arthritis without rheumatoid factor, left wrist: Secondary | ICD-10-CM | POA: Diagnosis present

## 2021-07-11 DIAGNOSIS — G8929 Other chronic pain: Secondary | ICD-10-CM | POA: Diagnosis present

## 2021-07-11 DIAGNOSIS — M25531 Pain in right wrist: Secondary | ICD-10-CM | POA: Insufficient documentation

## 2021-07-11 DIAGNOSIS — M25561 Pain in right knee: Secondary | ICD-10-CM | POA: Diagnosis present

## 2021-07-11 DIAGNOSIS — M25532 Pain in left wrist: Secondary | ICD-10-CM | POA: Insufficient documentation

## 2021-07-11 DIAGNOSIS — M352 Behcet's disease: Secondary | ICD-10-CM | POA: Diagnosis not present

## 2021-07-11 DIAGNOSIS — G894 Chronic pain syndrome: Secondary | ICD-10-CM | POA: Diagnosis present

## 2021-07-11 DIAGNOSIS — M797 Fibromyalgia: Secondary | ICD-10-CM | POA: Diagnosis not present

## 2021-07-11 DIAGNOSIS — M792 Neuralgia and neuritis, unspecified: Secondary | ICD-10-CM | POA: Diagnosis not present

## 2021-07-11 DIAGNOSIS — G2581 Restless legs syndrome: Secondary | ICD-10-CM | POA: Diagnosis present

## 2021-07-11 DIAGNOSIS — M25562 Pain in left knee: Secondary | ICD-10-CM | POA: Insufficient documentation

## 2021-07-11 MED ORDER — GABAPENTIN 100 MG PO CAPS: 100.0000 mg | ORAL_CAPSULE | Freq: Four times a day (QID) | ORAL | 2 refills | Status: DC

## 2021-07-11 MED ORDER — TRAMADOL HCL 50 MG PO TABS
50.0000 mg | ORAL_TABLET | Freq: Four times a day (QID) | ORAL | 2 refills | Status: AC | PRN
Start: 2021-07-11 — End: 2021-08-10

## 2021-07-11 NOTE — Progress Notes (Signed)
Nursing Pain Medication Assessment:  Safety precautions to be maintained throughout the outpatient stay will include: orient to surroundings, keep bed in low position, maintain call bell within reach at all times, provide assistance with transfer out of bed and ambulation.  Medication Inspection Compliance: Pill count conducted under aseptic conditions, in front of the patient. Neither the pills nor the bottle was removed from the patient's sight at any time. Once count was completed pills were immediately returned to the patient in their original bottle.  Medication: Tramadol (Ultram) Pill/Patch Count:  8 of 120 pills remain Pill/Patch Appearance: Markings consistent with prescribed medication Bottle Appearance: Standard pharmacy container. Clearly labeled. Filled Date: 2 / 02 / 2023 Last Medication intake:  Today

## 2021-07-13 NOTE — Progress Notes (Signed)
Subjective:  Patient ID: Rebekah Martin, female    DOB: [birth year]  Age: 46 y.o. MRN: [id number]  CC: Wrist Pain (bilateral) and Knee Pain (bilateral)   Procedure: None  HPI Rebekah Martin presents for reevaluation.  She was last seen a few months ago via virtual visit and presents today for review.  She continues to have diffuse body pain comparable to what she has experienced long-term with no recent changes.  The quality characteristic and distribution of this are stable in nature.  She is taking her tramadol and gabapentin as prescribed and continues to get generally good relief with these.  She has some breakthrough pain but this is manageable she reports.  The medications enable her to continue to work actively during the day and sleep well at night.  No side effects are reported.  Otherwise she is in her usual state of health with no new changes in upper or lower extremity strength or function.  Outpatient Medications Prior to Visit  Medication Sig Dispense Refill   Adalimumab (HUMIRA PEN) 40 MG/0.8ML PNKT Inject 0.8 mLs into the skin every 14 (fourteen) days. 2 each 11   Adalimumab 40 MG/0.8ML PNKT Inject into the skin.     albuterol (PROVENTIL HFA;VENTOLIN HFA) 108 (90 Base) MCG/ACT inhaler Inhale into the lungs.     albuterol (PROVENTIL) (2.5 MG/3ML) 0.083% nebulizer solution Inhale into the lungs.     Calcium Carbonate-Vit D-Min (CALCIUM 600 + MINERALS PO) Take 600 mg by mouth 2 (two) times daily.     clobetasol cream (TEMOVATE) 0.05 % Apply 1 application topically 2 (two) times daily. 60 g 0   Clobetasol Prop Emollient Base 0.05 % emollient cream Apply topically.     docusate sodium (COLACE) 100 MG capsule Take 100 mg by mouth 2 (two) times daily.     DULoxetine (CYMBALTA) 30 MG capsule Take by mouth.     DULoxetine (CYMBALTA) 30 MG capsule TAKE 1 CAPSULE (30 MG TOTAL) BY MOUTH ONCE DAILY 90 capsule 1   meloxicam (MOBIC) 15 MG tablet Take 15 mg by mouth daily as needed for pain.      metoCLOPramide (REGLAN) 10 MG tablet Take 1 tablet (10 mg total) by mouth every 6 (six) hours as needed for nausea. 40 tablet 1   Multiple Vitamins-Minerals (MULTIVITAMIN ADULT PO) Take by mouth daily.     ondansetron (ZOFRAN) 4 MG tablet 4 mg as needed.      propranolol (INDERAL) 10 MG tablet TAKE 1 TABLET BY MOUTH TWICE DAILY 60 tablet 11   ranitidine (ZANTAC) 75 MG tablet Take 75 mg by mouth 2 (two) times daily.     sulfaSALAzine (AZULFIDINE) 500 MG EC tablet Take 1,500 mg by mouth daily.   3   SUMAtriptan (IMITREX) 25 MG tablet Take as needed by mouth.      traMADol (ULTRAM) 50 MG tablet Take 1 tablet (50 mg total) by mouth every 6 (six) hours as needed. 120 tablet 2   triamcinolone (KENALOG) 0.1 % paste Apply to affected area(s) 2 times per day as needed     gabapentin (NEURONTIN) 100 MG capsule Take 1 capsule (100 mg total) by mouth 4 (four) times daily. 120 capsule 2   Facility-Administered Medications Prior to Visit  Medication Dose Route Frequency Provider Last Rate Last Admin   albuterol (PROVENTIL) (2.5 MG/3ML) 0.083% nebulizer solution 2.5 mg  2.5 mg Nebulization Once Particia Nearing, PA-C        Review of Systems CNS: No  confusion or sedation Cardiac: No angina or palpitations GI: No abdominal pain or constipation Constitutional: No nausea vomiting fevers or chills  Objective:  BP 136/84 (BP Location: Right Arm, Patient Position: Sitting, Cuff Size: Normal)   Pulse 95   Temp 97.8 F (36.6 C) (Temporal)   Resp 16   Ht  (1.626 m)   Wt 130 lb (59 kg)   SpO2 97%   BMI 22.31 kg/m    BP Readings from Last 3 Encounters:  07/11/21 136/84  03/21/18 131/88  01/06/18 122/82     Wt Readings from Last 3 Encounters:  07/11/21 130 lb (59 kg)  03/21/18 105 lb (47.6 kg)  01/06/18 105 lb (47.6 kg)     Physical Exam Pt is alert and oriented PERRL EOMI HEART IS RRR no murmur or rub LCTA no wheezing or rales MUSCULOSKELETAL reveals some paraspinous muscle  tenderness in the lumbar and thoracic region.  She is ambulating well with normal gait and good muscle tone and bulk.  Labs  No results found for: HGBA1C No results found for: GLUF, MICROALBUR, LDLCALC, CREATININE  -------------------------------------------------------------------------------------------------------------------- No results found for: WBC, HGB, HCT, PLT, GLUCOSE, CHOL, TRIG, HDL, LDLDIRECT, LDLCALC, ALT, AST, NA, K, CL, CREATININE, BUN, CO2, TSH, PSA, INR, GLUF, HGBA1C, MICROALBUR  --------------------------------------------------------------------------------------------------------------------- DG Chest 2 View  Result Date: 02/15/2016 CLINICAL DATA:  Cough for 10 days EXAM: CHEST  2 VIEW COMPARISON:  None. FINDINGS: Cardiomediastinal silhouette is unremarkable. No infiltrate or pleural effusion. No pulmonary edema. Mild hyperinflation. Bony thorax unremarkable. IMPRESSION: No active cardiopulmonary disease.  Mild hyperinflation Electronically Signed   By: Natasha Mead M.D.   On: 02/15/2016 13:37     Assessment & Plan:   Rebekah Martin was seen today for wrist pain and knee pain.  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 -     gabapentin (NEURONTIN) 100 MG capsule; Take 1 capsule (100 mg total) by mouth 4 (four) times daily.        ----------------------------------------------------------------------------------------------------------------------  Problem  List Items Addressed This Visit       Unprioritized   Behcet's syndrome (HCC)   Chronic pain syndrome   Relevant Medications   traMADol (ULTRAM) 50 MG tablet   gabapentin (NEURONTIN) 100 MG capsule   Other Relevant Orders   ToxASSURE Select 13 (MW), Urine   Chronic, continuous use of opioids   Relevant Orders   ToxASSURE Select 13 (MW), Urine   Knee pain, chronic   Relevant Medications   traMADol (ULTRAM) 50 MG tablet   gabapentin (NEURONTIN) 100 MG capsule   Restless legs syndrome   Wrist pain, chronic   Relevant Medications   traMADol (ULTRAM) 50 MG tablet   gabapentin (NEURONTIN) 100 MG capsule   Other Visit Diagnoses     Fibromyalgia syndrome    -  Primary   Relevant Medications   traMADol (ULTRAM) 50 MG tablet   gabapentin (NEURONTIN) 100 MG capsule  Neuropathic pain       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 Fibromyalgia syndrome Continue with her current regimen.  No other changes will be initiated today.  Refills will be given as indicated.  [facility] [place] [facility] [place] information and it is appropriate.  2. Behcet's syndrome (HCC) As above  3. Neuropathic pain As above  4. Chronic, continuous use of opioids  - ToxASSURE Select 13 (MW), Urine  5. 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  6. Pain in both wrists   7. Chronic pain of both knees   8. Chronic pain syndrome  - ToxASSURE Select 13 (MW), Urine  9. Restless legs syndrome   10. Chronic pain of both  wrists     ----------------------------------------------------------------------------------------------------------------------  I am having Rebekah Martin start on traMADol. I am also having her maintain her meloxicam, Calcium Carbonate-Vit D-Min (CALCIUM 600 + MINERALS PO), docusate sodium, Multiple Vitamins-Minerals (MULTIVITAMIN ADULT PO), sulfaSALAzine, SUMAtriptan, triamcinolone, Adalimumab, clobetasol cream, Clobetasol Prop Emollient Base, Adalimumab, albuterol, albuterol, DULoxetine, ondansetron, metoCLOPramide, ranitidine, propranolol, DULoxetine, traMADol, and gabapentin. We will continue to administer albuterol.   Meds ordered this encounter  Medications   traMADol (ULTRAM) 50 MG tablet    Sig: Take 1 tablet (50 mg total) by mouth every 6 (six) hours as needed.    Dispense:  120 tablet    Refill:  2   gabapentin (NEURONTIN) 100 MG capsule    Sig: Take 1 capsule (100 mg total) by mouth 4 (four) times daily.    Dispense:  120 capsule    Refill:  2   Patient's Medications  New Prescriptions   TRAMADOL (ULTRAM) 50 MG TABLET    Take 1 tablet (50 mg total) by mouth every 6 (six) hours as needed.  Previous Medications   ADALIMUMAB (HUMIRA PEN) 40 MG/0.8ML PNKT    Inject 0.8 mLs into the skin every 14 (fourteen) days.   ADALIMUMAB 40 MG/0.8ML PNKT    Inject into the skin.   ALBUTEROL (PROVENTIL HFA;VENTOLIN HFA) 108 (90 BASE) MCG/ACT INHALER    Inhale into the lungs.   ALBUTEROL (PROVENTIL) (2.5 MG/3ML) 0.083% NEBULIZER SOLUTION    Inhale into the lungs.   CALCIUM CARBONATE-VIT D-MIN (CALCIUM 600 + MINERALS PO)    Take 600 mg by mouth 2 (two) times daily.   CLOBETASOL CREAM (TEMOVATE) 0.05 %    Apply 1 application topically 2 (two) times daily.   CLOBETASOL PROP EMOLLIENT BASE 0.05 % EMOLLIENT CREAM    Apply topically.   DOCUSATE SODIUM (COLACE) 100 MG CAPSULE    Take 100 mg by mouth 2 (two) times daily.   DULOXETINE (CYMBALTA) 30 MG CAPSULE    Take by mouth.   DULOXETINE  (CYMBALTA) 30 MG CAPSULE    TAKE 1 CAPSULE (30 MG TOTAL) BY MOUTH ONCE DAILY   MELOXICAM (MOBIC) 15 MG TABLET    Take 15 mg by mouth daily as needed for pain.   METOCLOPRAMIDE (REGLAN) 10 MG TABLET    Take 1 tablet (10 mg total) by mouth every 6 (six) hours as needed for nausea.   MULTIPLE VITAMINS-MINERALS (MULTIVITAMIN ADULT PO)    Take by mouth daily.  ONDANSETRON (ZOFRAN) 4 MG TABLET    4 mg as needed.    PROPRANOLOL (INDERAL) 10 MG TABLET    TAKE 1 TABLET BY MOUTH TWICE DAILY   RANITIDINE (ZANTAC) 75 MG TABLET    Take 75 mg by mouth 2 (two) times daily.   SULFASALAZINE (AZULFIDINE) 500 MG EC TABLET    Take 1,500 mg by mouth daily.    SUMATRIPTAN (IMITREX) 25 MG TABLET    Take as needed by mouth.    TRAMADOL (ULTRAM) 50 MG TABLET    Take 1 tablet (50 mg total) by mouth every 6 (six) hours as needed.   TRIAMCINOLONE (KENALOG) 0.1 % PASTE    Apply to affected area(s) 2 times per day as needed  Modified Medications   Modified Medication Previous Medication   GABAPENTIN (NEURONTIN) 100 MG CAPSULE gabapentin (NEURONTIN) 100 MG capsule      Take 1 capsule (100 mg total) by mouth 4 (four) times daily.    Take 1 capsule (100 mg total) by mouth 4 (four) times daily.  Discontinued Medications   No medications on file   ----------------------------------------------------------------------------------------------------------------------  Follow-up: Return in about 3 months (around 10/11/2021) for evaluation, med refill.    [name], MD

## 2021-07-14 LAB — TOXASSURE SELECT 13 (MW), URINE

## 2021-12-06 ENCOUNTER — Ambulatory Visit: Payer: 59 | Admitting: Anesthesiology

## 2021-12-12 ENCOUNTER — Encounter: Payer: Self-pay | Admitting: Anesthesiology

## 2021-12-12 ENCOUNTER — Ambulatory Visit: Payer: 59 | Attending: Anesthesiology | Admitting: Anesthesiology

## 2021-12-12 DIAGNOSIS — F1190 Opioid use, unspecified, uncomplicated: Secondary | ICD-10-CM | POA: Diagnosis not present

## 2021-12-12 DIAGNOSIS — M797 Fibromyalgia: Secondary | ICD-10-CM | POA: Diagnosis not present

## 2021-12-12 DIAGNOSIS — M792 Neuralgia and neuritis, unspecified: Secondary | ICD-10-CM | POA: Diagnosis not present

## 2021-12-12 DIAGNOSIS — M25531 Pain in right wrist: Secondary | ICD-10-CM

## 2021-12-12 DIAGNOSIS — G894 Chronic pain syndrome: Secondary | ICD-10-CM

## 2021-12-12 DIAGNOSIS — M06032 Rheumatoid arthritis without rheumatoid factor, left wrist: Secondary | ICD-10-CM

## 2021-12-12 DIAGNOSIS — M25532 Pain in left wrist: Secondary | ICD-10-CM

## 2021-12-12 DIAGNOSIS — M25561 Pain in right knee: Secondary | ICD-10-CM

## 2021-12-12 DIAGNOSIS — M352 Behcet's disease: Secondary | ICD-10-CM

## 2021-12-12 DIAGNOSIS — G8929 Other chronic pain: Secondary | ICD-10-CM

## 2021-12-12 DIAGNOSIS — M06031 Rheumatoid arthritis without rheumatoid factor, right wrist: Secondary | ICD-10-CM

## 2021-12-12 DIAGNOSIS — M25562 Pain in left knee: Secondary | ICD-10-CM

## 2021-12-12 MED ORDER — GABAPENTIN 100 MG PO CAPS: 100.0000 mg | ORAL_CAPSULE | Freq: Four times a day (QID) | ORAL | 2 refills | Status: AC

## 2021-12-12 MED ORDER — TRAMADOL HCL 50 MG PO TABS: 50.0000 mg | ORAL_TABLET | Freq: Four times a day (QID) | ORAL | 2 refills | Status: AC | PRN

## 2021-12-12 NOTE — Progress Notes (Signed)
Virtual Visit via Telephone Note  I connected with Rebekah Martin on 12/12/21 at  1:00 PM EDT by telephone and verified that I am speaking with the correct person using two identifiers.  Location: Patient: Home Provider: [facility] control center   I discussed the limitations, risks, security and privacy concerns of performing an evaluation and management service by telephone and the availability of in person appointments. I also discussed with the patient that there may be a patient responsible charge related to this service. The patient expressed understanding and agreed to proceed.   History of Present Illness: I spoke with Rebekah Martin today via telephone as we are unable link for the video portion of the conference.  She reports that she is still having her baseline thoracic and lumbar pain comparable to what she is experienced in the past.  No significant changes in the quality characteristic or distribution are noted.  She takes her tramadol 4 times a day with continued relief based 75% lasting about 4 to 6 hours.  She uses her meloxicam additionally and this combination is working well for her.  She still doing her stretching strengthening exercises as best possible.  No other changes are noted.  Upper extremity strength lower extremity strength is at baseline.  Review of systems: General: No fevers or chills Pulmonary: No shortness of breath or dyspnea Cardiac: No angina or palpitations or lightheadedness GI: No abdominal pain or constipation Psych: No depression    Observations/Objective:  Current Outpatient Medications:    Adalimumab (HUMIRA PEN) 40 MG/0.8ML PNKT, Inject 0.8 mLs into the skin every 14 (fourteen) days., Disp: 2 each, Rfl: 11   Adalimumab 40 MG/0.8ML PNKT, Inject into the skin., Disp: , Rfl:    albuterol (PROVENTIL HFA;VENTOLIN HFA) 108 (90 Base) MCG/ACT inhaler, Inhale into the lungs., Disp: , Rfl:    albuterol (PROVENTIL) (2.5 MG/3ML) 0.083% nebulizer solution, Inhale  into the lungs., Disp: , Rfl:    Calcium Carbonate-Vit D-Min (CALCIUM 600 + MINERALS PO), Take 600 mg by mouth 2 (two) times daily., Disp: , Rfl:    clobetasol cream (TEMOVATE) 0.05 %, Apply 1 application topically 2 (two) times daily., Disp: 60 g, Rfl: 0   Clobetasol Prop Emollient Base 0.05 % emollient cream, Apply topically., Disp: , Rfl:    docusate sodium (COLACE) 100 MG capsule, Take 100 mg by mouth 2 (two) times daily., Disp: , Rfl:    DULoxetine (CYMBALTA) 30 MG capsule, Take by mouth., Disp: , Rfl:    DULoxetine (CYMBALTA) 30 MG capsule, TAKE 1 CAPSULE (30 MG TOTAL) BY MOUTH ONCE DAILY, Disp: 90 capsule, Rfl: 1   gabapentin (NEURONTIN) 100 MG capsule, Take 1 capsule (100 mg total) by mouth 4 (four) times daily., Disp: 120 capsule, Rfl: 2   meloxicam (MOBIC) 15 MG tablet, Take 15 mg by mouth daily as needed for pain., Disp: , Rfl:    metoCLOPramide (REGLAN) 10 MG tablet, Take 1 tablet (10 mg total) by mouth every 6 (six) hours as needed for nausea., Disp: 40 tablet, Rfl: 1   Multiple Vitamins-Minerals (MULTIVITAMIN ADULT PO), Take by mouth daily., Disp: , Rfl:    ondansetron (ZOFRAN) 4 MG tablet, 4 mg as needed. , Disp: , Rfl:    propranolol (INDERAL) 10 MG tablet, TAKE 1 TABLET BY MOUTH TWICE DAILY, Disp: 60 tablet, Rfl: 11   ranitidine (ZANTAC) 75 MG tablet, Take 75 mg by mouth 2 (two) times daily., Disp: , Rfl:    sulfaSALAzine (AZULFIDINE) 500 MG EC tablet, Take  1,500 mg by mouth daily. , Disp: , Rfl: 3   SUMAtriptan (IMITREX) 25 MG tablet, Take as needed by mouth. , Disp: , Rfl:    traMADol (ULTRAM) 50 MG tablet, Take 1 tablet (50 mg total) by mouth every 6 (six) hours as needed., Disp: 120 tablet, Rfl: 2   traMADol (ULTRAM) 50 MG tablet, Take 1 tablet (50 mg total) by mouth every 6 (six) hours as needed., Disp: 120 tablet, Rfl: 2   triamcinolone (KENALOG) 0.1 % paste, Apply to affected area(s) 2 times per day as needed, Disp: , Rfl:   Current Facility-Administered Medications:     albuterol (PROVENTIL) (2.5 MG/3ML) 0.083% nebulizer solution 2.5 mg, 2.5 mg, Nebulization, Once, Volney American, PA-C   Past Medical History:  Diagnosis Date   Allergy    Anxiety    Behcet's disease ([place])    Behcet's disease ([place]) 08/25/2018   Chronic pain syndrome 08/25/2018   Chronic, continuous use of opioids 08/25/2018   Fibromyalgia    Knee pain, chronic 12/25/2018   Migraine    Mitral valve prolapse    Positive ANA (antinuclear antibody)    RA (rheumatoid arthritis) (HCC)    Restless legs syndrome 01/14/2020   Rheumatoid arthritis (HCC)    Tachycardia    Wrist pain, chronic 12/25/2018     Assessment and Plan: 1. Fibromyalgia syndrome   2. Behcet's syndrome ([place])   3. Neuropathic pain   4. Chronic, continuous use of opioids   5. 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 Pain in both wrists   7. Chronic pain of both knees   8. Chronic pain syndrome   Based on our conversation today I think it is appropriate to continue with her current medication regimen.  I have reviewed the [facility] practitioner database information it is appropriate for refill.  This will be for tramadol 50 mg tablet 4 times a day.  Continue with stretching strengthening exercises as reviewed today continue follow-up with her primary care physicians for baseline medical care with return to clinic scheduled in 3 months.  Follow Up Instructions:    I discussed the assessment and treatment plan with the patient. The patient was provided an opportunity to ask questions and all were answered. The patient agreed with the plan and demonstrated an understanding of the instructions.   The patient was advised to call back or seek an in-person evaluation if the symptoms worsen or if the condition fails to improve as anticipated.  I provided 30 minutes of non-face-to-face time during this encounter.   Molli Barrows, MD

## 2022-04-25 ENCOUNTER — Other Ambulatory Visit: Payer: Self-pay | Admitting: Anesthesiology

## 2022-05-24 ENCOUNTER — Other Ambulatory Visit: Payer: Self-pay | Admitting: Physician Assistant

## 2022-05-24 DIAGNOSIS — N852 Hypertrophy of uterus: Secondary | ICD-10-CM

## 2023-05-29 ENCOUNTER — Other Ambulatory Visit: Payer: Self-pay | Admitting: Physician Assistant

## 2023-05-29 DIAGNOSIS — N852 Hypertrophy of uterus: Secondary | ICD-10-CM
